# Patient Record
Sex: Female | Born: 1985 | Race: White | Hispanic: No | Marital: Married | Smoking: Never smoker
Health system: Southern US, Community
[De-identification: ages and names within clinical notes are randomized; demographics above are authoritative.]

## PROBLEM LIST (undated history)

## (undated) ENCOUNTER — Inpatient Hospital Stay: Payer: Self-pay

## (undated) DIAGNOSIS — O24419 Gestational diabetes mellitus in pregnancy, unspecified control: Secondary | ICD-10-CM

## (undated) DIAGNOSIS — E669 Obesity, unspecified: Secondary | ICD-10-CM

## (undated) HISTORY — DX: Obesity, unspecified: E66.9

## (undated) HISTORY — PX: NO PAST SURGERIES: SHX2092

## (undated) HISTORY — DX: Gestational diabetes mellitus in pregnancy, unspecified control: O24.419

---

## 2010-04-04 ENCOUNTER — Emergency Department: Payer: Self-pay | Admitting: Internal Medicine

## 2010-06-25 ENCOUNTER — Emergency Department: Payer: Self-pay | Admitting: Emergency Medicine

## 2011-10-01 ENCOUNTER — Emergency Department: Payer: Self-pay | Admitting: Emergency Medicine

## 2012-04-25 ENCOUNTER — Observation Stay: Payer: Self-pay

## 2012-04-27 LAB — BETA STREP CULTURE(ARMC)

## 2012-05-16 ENCOUNTER — Inpatient Hospital Stay: Payer: Self-pay | Admitting: Obstetrics & Gynecology

## 2012-05-16 LAB — CBC WITH DIFFERENTIAL/PLATELET
Basophil %: 0.2 %
HCT: 33.1 % — ABNORMAL LOW (ref 35.0–47.0)
HGB: 11.6 g/dL — ABNORMAL LOW (ref 12.0–16.0)
Lymphocyte #: 2.2 10*3/uL (ref 1.0–3.6)
MCH: 31.3 pg (ref 26.0–34.0)
MCHC: 34.9 g/dL (ref 32.0–36.0)
MCV: 90 fL (ref 80–100)
Monocyte %: 7.6 %
Neutrophil #: 6.6 10*3/uL — ABNORMAL HIGH (ref 1.4–6.5)
RDW: 14.3 % (ref 11.5–14.5)

## 2012-05-18 LAB — HEMATOCRIT: HCT: 29.4 % — ABNORMAL LOW (ref 35.0–47.0)

## 2012-06-28 LAB — HM PAP SMEAR: HM Pap smear: NEGATIVE

## 2012-09-20 ENCOUNTER — Emergency Department: Payer: Self-pay | Admitting: Emergency Medicine

## 2013-01-25 ENCOUNTER — Emergency Department: Payer: Self-pay | Admitting: Emergency Medicine

## 2013-04-17 ENCOUNTER — Emergency Department: Payer: Self-pay | Admitting: Emergency Medicine

## 2013-05-27 ENCOUNTER — Emergency Department: Payer: Self-pay | Admitting: Family Medicine

## 2013-08-18 IMAGING — CR DG LUMBAR SPINE 2-3V
1 series · 4 of 4 positions shown · non-contrast
Comparison: none

REASON FOR EXAM: radicular LBP
COMMENTS:   LMP: IUD

PROCEDURE:     DXR - DXR LUMBAR SPINE AP AND LATERAL  - January 25, 2013  [DATE]
RESULT:     Comparison: None.

[Series 1: t lumbar spine ap · 0.14mm/px · 4 of 4 slices shown]
[im 1/4]
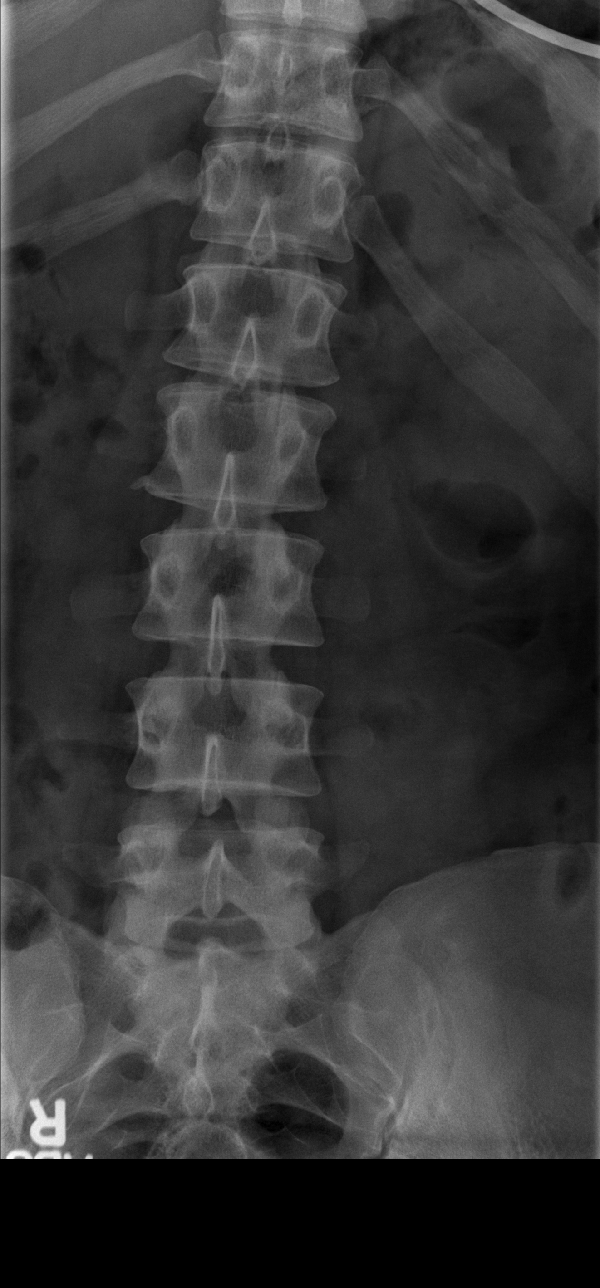
[im 2/4]
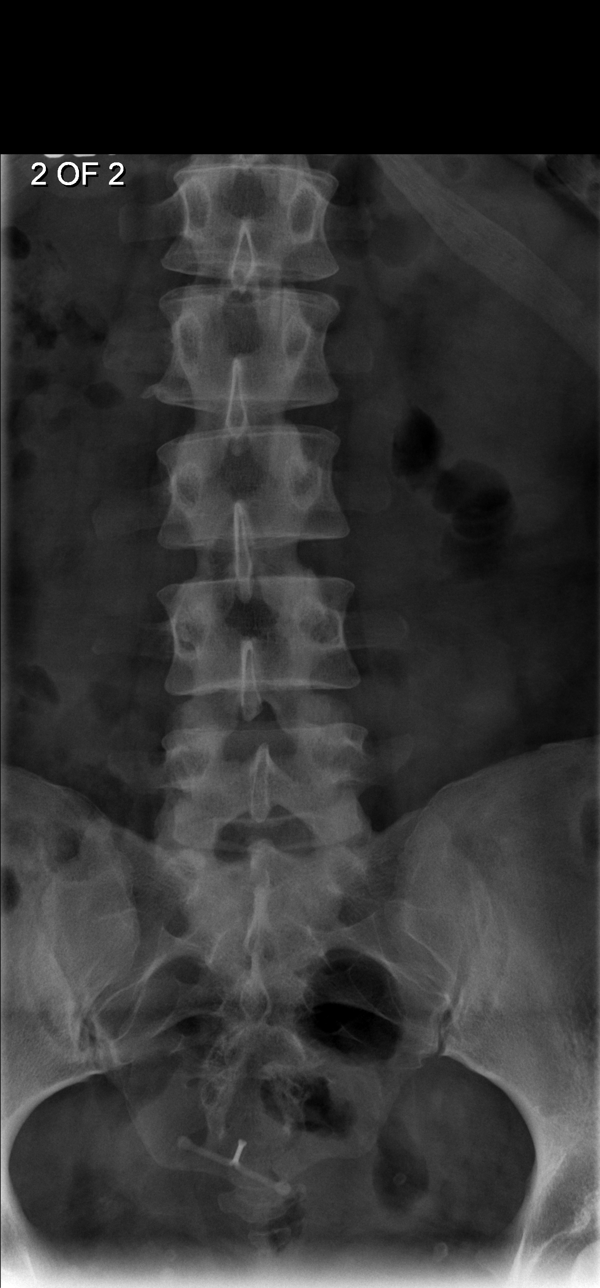
[im 3/4]
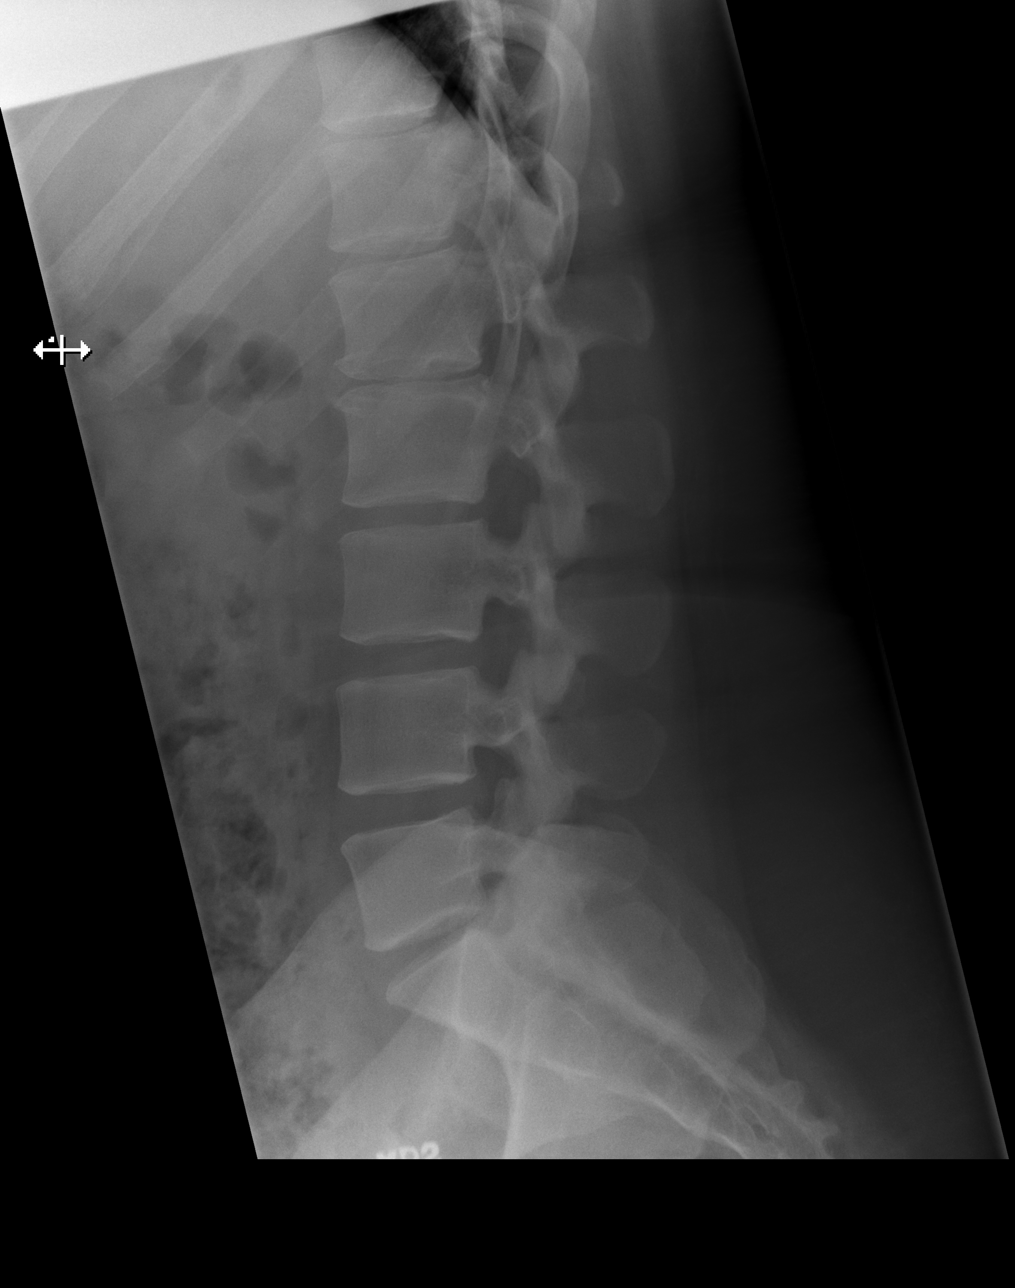
[im 4/4]
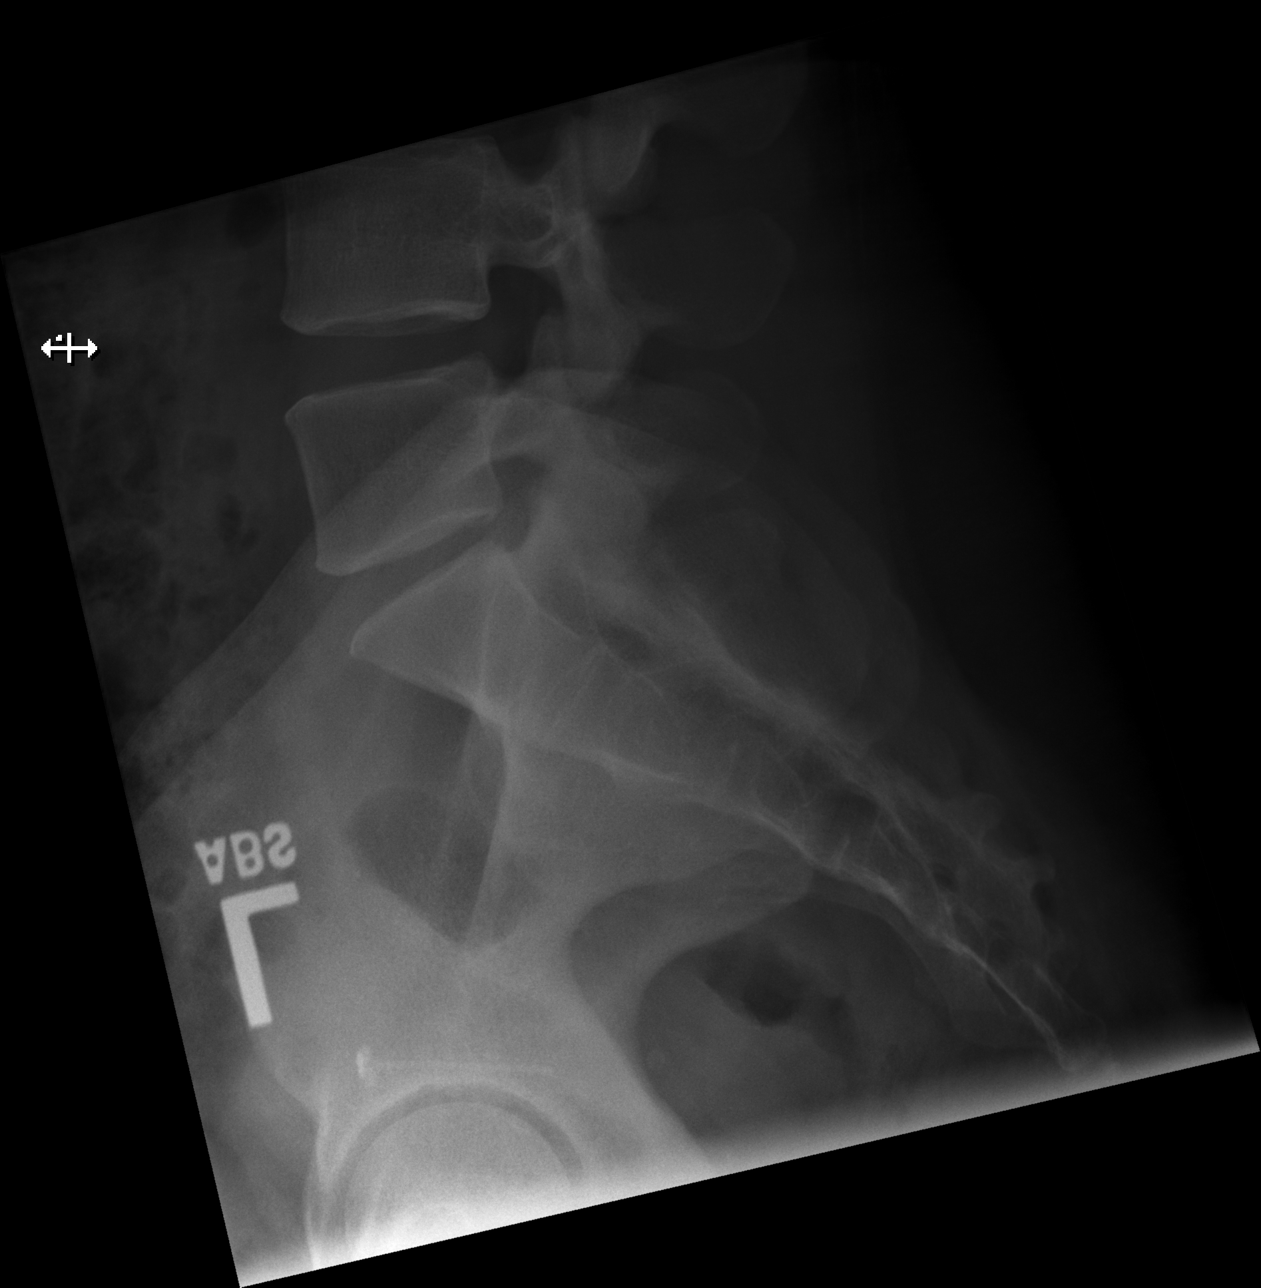

[4 of 4 positions shown; findings below may reference images not displayed]

FINDINGS: There are 5 lumbar type vertebral bodies. Mild degenerative disc disease is
seen at L1-L2. Vertebral body heights are preserved. Normal alignment. An
intrauterine contraceptive device projects over the pelvis.
IMPRESSION: Degenerative disc disease at L1-L2.

[REDACTED]

## 2014-03-15 ENCOUNTER — Emergency Department: Payer: Self-pay | Admitting: Emergency Medicine

## 2014-07-19 ENCOUNTER — Emergency Department: Payer: Self-pay | Admitting: Emergency Medicine

## 2015-01-14 NOTE — H&P (Signed)
L&D Evaluation:  History Expanded:   HPI 29 yo G2 P1, presents at 1537 3/7 weeks with c/o contractions. EDD of 05/13/12 per LMP and 9 week US. Dilated 1 cm on admission, cervix unchanged. [facility] for HR protocol for BMI 41. Fetus in transverse lie at last PN visit, consult for ECV this week.    Blood Type (Maternal) O positive    Group B Strep Results Maternal (Result >5wks must be treated as unknown) unknown/result > 5 weeks ago    Maternal HIV Negative    Maternal Syphilis Ab Nonreactive    Maternal Varicella Immune    Rubella Results (Maternal) immune    Maternal T-Dap Unknown    Patient's Medical History Obesity    Patient's Surgical History none    Medications Pre Natal Vitamins    Allergies NKDA    Social History none   ROS:   ROS see HPI   Exam:   Vital Signs stable    General no apparent distress    Mental Status clear    Abdomen gravid, tender with fetal movements    Pelvic 1/long/high    Mebranes Intact    FHT normal rate with no decels    Ucx irregular   Impression:   Impression early labor   Plan:   Plan discharge    Comments GBS swab drawn as pt did not have done in office last week   Electronic Signatures: Vella KohlerBrothers, Lareina Espino K (CNM)  (Signed 20-Aug-13 14:05)  Authored: L&D Evaluation   Last Updated: 20-Aug-13 14:05 by Vella KohlerBrothers, Blayke Cordrey K (CNM)

## 2017-08-19 ENCOUNTER — Ambulatory Visit (INDEPENDENT_AMBULATORY_CARE_PROVIDER_SITE_OTHER): Payer: Medicaid Other | Admitting: Advanced Practice Midwife

## 2017-08-19 ENCOUNTER — Encounter: Payer: Self-pay | Admitting: Advanced Practice Midwife

## 2017-08-19 VITALS — BP 112/78 | HR 91 | Ht 69.0 in | Wt 320.0 lb

## 2017-08-19 DIAGNOSIS — Z124 Encounter for screening for malignant neoplasm of cervix: Secondary | ICD-10-CM

## 2017-08-19 DIAGNOSIS — Z01419 Encounter for gynecological examination (general) (routine) without abnormal findings: Secondary | ICD-10-CM

## 2017-08-19 DIAGNOSIS — Z309 Encounter for contraceptive management, unspecified: Secondary | ICD-10-CM | POA: Diagnosis not present

## 2017-08-19 DIAGNOSIS — Z30432 Encounter for removal of intrauterine contraceptive device: Secondary | ICD-10-CM | POA: Diagnosis not present

## 2017-08-19 NOTE — Progress Notes (Signed)
Patient ID: Patricia Vazquez, female   DOB: 26-Nov-1985, 31 y.o.   MRN: [id number]     Gynecology Annual Exam  PCP: Patient, No Pcp Per  Chief Complaint:  Chief Complaint  Patient presents with  . Gynecologic Exam  . IUD removal    History of Present Illness: Patient is a 31 y.o. G2P2 presents for annual exam. The patient has no complaints today. Her IUD is due for removal. She plans hormone free interval and may want pregnancy.   LMP: No LMP recorded. Average Interval: irregular, not applicable  Duration of flow: 1 day Heavy Menses: spotting Clots: no Intermenstrual Bleeding: no Postcoital Bleeding: no Dysmenorrhea: no  The patient is sexually active. She currently uses IUD for contraception. She denies dyspareunia.  The patient does perform self breast exams.  There is no notable family history of breast or ovarian cancer in her family.  The patient wears seatbelts: yes.   The patient has regular exercise: she is active but denies adequate cardio activity.    The patient denies current symptoms of depression.    Review of Systems: Review of Systems  Constitutional: Negative.   HENT: Negative.   Eyes: Negative.   Respiratory: Negative.   Cardiovascular: Negative.   Gastrointestinal: Negative.   Genitourinary: Negative.   Musculoskeletal: Negative.   Skin: Negative.   Neurological: Negative.   Endo/Heme/Allergies: Negative.   Psychiatric/Behavioral: Negative.     Past Medical History:  History reviewed. No pertinent past medical history.  Past Surgical History:  History reviewed. No pertinent surgical history.  Gynecologic History:  No LMP recorded. Contraception: IUD Last Pap: 5 years ago Results were: no abnormalities   Obstetric History: G2P2  Family History:  History reviewed. No pertinent family history.  Social History:  Social History   Socioeconomic History  . Marital status: Single    Spouse name: Not on file  . Number of children: Not on  file  . Years of education: Not on file  . Highest education level: Not on file  Social Needs  . Financial resource strain: Not on file  . Food insecurity - worry: Not on file  . Food insecurity - inability: Not on file  . Transportation needs - medical: Not on file  . Transportation needs - non-medical: Not on file  Occupational History  . Not on file  Tobacco Use  . Smoking status: Never Smoker  . Smokeless tobacco: Never Used  Substance and Sexual Activity  . Alcohol use: Yes  . Drug use: No  . Sexual activity: Yes    Partners: Male    Birth control/protection: None, IUD  Other Topics Concern  . Not on file  Social History Narrative  . Not on file    Allergies:  No Known Allergies  Medications: Prior to Admission medications   Not on File    Physical Exam Vitals: Blood pressure 112/78, pulse 91, height 5\' 9"  (1.753 m), weight (!) 320 lb (145.2 kg).  General: NAD HEENT: normocephalic, anicteric Thyroid: no enlargement, no palpable nodules Pulmonary: No increased work of breathing, CTAB Cardiovascular: RRR, distal pulses 2+ Breast: Breast symmetrical, no tenderness, no palpable nodules or masses, no skin or nipple retraction present, no nipple discharge.  No axillary or supraclavicular lymphadenopathy. Abdomen: NABS, soft, non-tender, non-distended.  Umbilicus without lesions.  No hepatomegaly, splenomegaly or masses palpable. No evidence of hernia  Genitourinary:  External: Normal external female genitalia.  Normal urethral meatus, normal  Bartholin's and Skene's glands.    Vagina: Normal  vaginal mucosa, no evidence of prolapse.    Cervix: Grossly normal in appearance, no bleeding, no CMT  Uterus: Non-enlarged, mobile, normal contour.    Adnexa: ovaries non-enlarged, no adnexal masses  Rectal: deferred  Lymphatic: no evidence of inguinal lymphadenopathy Extremities: no edema, erythema, or tenderness Neurologic: Grossly intact Psychiatric: mood appropriate,  affect full   Assessment: 31 y.o. G2P2 routine annual exam  Plan: Problem List Items Addressed This Visit    None    Visit Diagnoses    Well woman exam with routine gynecological exam    -  Primary   Relevant Orders   IGP, Aptima HPV   Cervical cancer screening       Relevant Orders   IGP, Aptima HPV      1) STI screening was offered and declined  2) ASCCP guidelines and rational discussed.  Patient opts for every 5 years screening interval  3) Contraception - none  4) Routine healthcare maintenance including cholesterol, diabetes screening discussed managed by PCP  5) Follow up 1 year for routine annual exam  [name], CNM       GYNECOLOGY OFFICE PROCEDURE NOTE  Patricia CarolinaKimberly L Mcomber is a 31 y.o. G2P2 here for IUD removal. The patient currently has a Mirena IUD placed on 07/10/2012.  No GYN concerns.  Last pap smear was on 06/29/2012 and was normal.  IUD Removal and Reinsertion  Patient identified, informed consent performed, consent signed. Time out was performed. Speculum placed in the vagina. The strings of the IUD were grasped and pulled using curved forceps. The IUD was successfully removed in its entirety. Patient tolerated procedure well.   Patient was instructed to take Ibuprofen for discomfort following removal of IUD.   Tresea MallJane Gedeon Brandow, CNM  [facility], [facility]   IUD remval [id number] Modifer 25, plus Modifer 79 is done during a global billing visit

## 2017-08-24 LAB — IGP, APTIMA HPV
HPV Aptima: NEGATIVE
PAP Smear Comment: 0

## 2017-10-12 ENCOUNTER — Ambulatory Visit (INDEPENDENT_AMBULATORY_CARE_PROVIDER_SITE_OTHER): Payer: Medicaid Other | Admitting: Maternal Newborn

## 2017-10-12 ENCOUNTER — Encounter: Payer: Self-pay | Admitting: Maternal Newborn

## 2017-10-12 VITALS — BP 120/70 | Wt 322.0 lb

## 2017-10-12 DIAGNOSIS — Z3A01 Less than 8 weeks gestation of pregnancy: Secondary | ICD-10-CM

## 2017-10-12 DIAGNOSIS — Z6841 Body Mass Index (BMI) 40.0 and over, adult: Secondary | ICD-10-CM

## 2017-10-12 DIAGNOSIS — O99210 Obesity complicating pregnancy, unspecified trimester: Secondary | ICD-10-CM | POA: Insufficient documentation

## 2017-10-12 DIAGNOSIS — O99211 Obesity complicating pregnancy, first trimester: Secondary | ICD-10-CM | POA: Diagnosis not present

## 2017-10-12 DIAGNOSIS — Z3480 Encounter for supervision of other normal pregnancy, unspecified trimester: Secondary | ICD-10-CM

## 2017-10-12 DIAGNOSIS — O0990 Supervision of high risk pregnancy, unspecified, unspecified trimester: Secondary | ICD-10-CM | POA: Insufficient documentation

## 2017-10-12 NOTE — Progress Notes (Signed)
10/12/2017   Chief Complaint: Positive home pregnancy test, desires prenatal care.  Transfer of Care Patient: no  History of Present Illness: Ms. Landry Mellowerrell is a 32 y.o. G3P2002 at 7473w3d based on Patient's last menstrual period on 08/21/2017, with an Estimated Date of Delivery: 05/28/18, with the above CC.   She had no periods with IUD, which was removed on 08/19/2017. She had bleeding once following IUD removal and conceived after that. Her LMP is not certain. Adjust dating as needed after early ultrasound complete. She was using no method when she conceived.  She has Positive signs or symptoms of nausea of pregnancy. Does not desire [place] at this time. She has Negative signs or symptoms of miscarriage or preterm labor She identifies Negative Zika risk factors for her and her partner On any different medications around the time she conceived/early pregnancy: No  History of varicella: Yes   ROS: A 12-point review of systems was performed and negative, except as stated in the above HPI.  OBGYN History: As per HPI. OB History  Gravida Para Term Preterm AB Living  3 2 2     2   SAB TAB Ectopic Multiple Live Births          2    # Outcome Date GA Lbr Len/2nd Weight Sex Delivery Anes PTL Lv  3 Current           2 Term 05/17/12 2759w0d  9 lb 1 oz (4.111 kg) F Vag-Spont   LIV  1 Term 05/14/08 4259w0d  7 lb 1 oz (3.204 kg) M Vag-Spont   LIV     Birth Comments: BABY C POLYCYTHEMIA, JAUNDICE, HYPOTHERMIA, AND HYPOGLYCEMIA, STAYED IN NICU X4-5DAYS      Any issues with any prior pregnancies: no Any prior children are healthy, doing well, without any problems or issues: yes History of pap smears: Yes. Last pap smear 08/19/2017. NILM/HPV Negative History of STIs: Yes, trichomoniasis in 08/2017.   Past Medical History: Past Medical History:  Diagnosis Date  . Obesity     Past Surgical History: Past Surgical History:  Procedure Laterality Date  . NO PAST SURGERIES      Family History:    History reviewed. No pertinent family history. She denies any female cancers, bleeding or blood clotting disorders.  She denies any history of intellectual disability, birth defects or genetic disorders in her or the FOB's history  Social History:  Social History   Socioeconomic History  . Marital status: Married    Spouse name: Not on file  . Number of children: 2  . Years of education: 5412  . Highest education level: Not on file  Social Needs  . Financial resource strain: Not on file  . Food insecurity - worry: Not on file  . Food insecurity - inability: Not on file  . Transportation needs - medical: Not on file  . Transportation needs - non-medical: Not on file  Occupational History  . Occupation: Conservation officer, [employer]  Tobacco Use  . Smoking status: Never Smoker  . Smokeless tobacco: Never Used  Substance and Sexual Activity  . Alcohol use: Yes  . Drug use: No  . Sexual activity: Yes    Partners: Male    Birth control/protection: None  Other Topics Concern  . Not on file  Social History Narrative  . Not on file   Any cats in the household: no Denies history of and current domestic violence.  Allergy: Allergies  Allergen Reactions  . Other  SEASONAL    Current Outpatient Medications: No current outpatient medications on file.   Physical Exam:   BP 120/70   Wt (!) 322 lb (146.1 kg)   LMP 08/21/2017   BMI 47.55 kg/m  Body mass index is 47.55 kg/m. Constitutional: Obese, well developed female in no acute distress.  Neck:  Supple, normal appearance, and no thyromegaly  Cardiovascular: S1, S2 normal, no murmur, rub or gallop, regular rate and rhythm Respiratory:  Clear to auscultation bilaterally. Normal respiratory effort Abdomen: no masses, hernias; diffusely non tender to palpation, non distended Breasts: right breast normal without mass, skin or nipple changes or axillary nodes, left breast normal without mass, skin or nipple changes or axillary  nodes. Neuro/Psych:  Normal mood and affect.  Skin:  Warm and dry.  Lymphatic:  No inguinal lymphadenopathy.   Pelvic exam: is limited by body habitus External genitalia, Bartholin's glands, Urethra, Skene's glands: within normal limits Vagina: within normal limits and with no blood in the vault  Cervix: normal appearing cervix without discharge or lesions, closed/long/high Uterus:  normal contour Adnexa:  normal adnexa  Assessment: Ms. Rarick is a 32 y.o. G3P2002 at [redacted]w[redacted]d based on Patient's last menstrual period on 08/21/2017, with an Estimated Date of Delivery: 05/28/18, presenting for prenatal care.  Plan:  1) Avoid alcoholic beverages. 2) Patient encouraged not to smoke.  3) Discontinue the use of all non-medicinal drugs and chemicals.  4) Take prenatal vitamins daily.  5) Seatbelt use advised. 6) Nutrition, food safety (fish, cheese advisories, and high nitrite foods) and exercise discussed. 7) Hospital and practice style and delivering at [facility] discussed.  8) Patient is asked about travel to areas at risk for the Zika virus, and counseled to avoid travel and exposure to mosquitoes or sexual partners who may have themselves been exposed to the virus. Testing is discussed, and will be ordered as appropriate.  9) Childbirth classes at [facility] advised. 10) Genetic Screening, such as with 1st Trimester Screening, cell free fetal DNA, AFP testing, and Ultrasound, as well as with amniocentesis and CVS as appropriate, is discussed with patient. She plans to decline genetic testing this pregnancy. 11) BMI>45, GTT with NOB labs, schedule labs for next visit. 12) Ultrasound ordered for dates/viability.  Problem list reviewed and updated.  Return in about 1 week (around 10/19/2017) for ROB, ultrasound, GTT/labs.  [name], CNM [facility], [facility] 10/12/2017  9:58 AM

## 2017-10-12 NOTE — Patient Instructions (Signed)
First Trimester of Pregnancy The first trimester of pregnancy is from week 1 until the end of week 13 (months 1 through 3). A week after a sperm fertilizes an egg, the egg will implant on the wall of the uterus. This embryo will begin to develop into a baby. Genes from you and your partner will form the baby. The female genes will determine whether the baby will be a boy or a girl. At 6-8 weeks, the eyes and face will be formed, and the heartbeat can be seen on ultrasound. At the end of 12 weeks, all the baby's organs will be formed. Now that you are pregnant, you will want to do everything you can to have a healthy baby. Two of the most important things are to get good prenatal care and to follow your health care provider's instructions. Prenatal care is all the medical care you receive before the baby's birth. This care will help prevent, find, and treat any problems during the pregnancy and childbirth. Body changes during your first trimester Your body goes through many changes during pregnancy. The changes vary from woman to woman.  You may gain or lose a couple of pounds at first.  You may feel sick to your stomach (nauseous) and you may throw up (vomit). If the vomiting is uncontrollable, call your health care provider.  You may tire easily.  You may develop headaches that can be relieved by medicines. All medicines should be approved by your health care provider.  You may urinate more often. Painful urination may mean you have a bladder infection.  You may develop heartburn as a result of your pregnancy.  You may develop constipation because certain hormones are causing the muscles that push stool through your intestines to slow down.  You may develop hemorrhoids or swollen veins (varicose veins).  Your breasts may begin to grow larger and become tender. Your nipples may stick out more, and the tissue that surrounds them (areola) may become darker.  Your gums may bleed and may be  sensitive to brushing and flossing.  Dark spots or blotches (chloasma, mask of pregnancy) may develop on your face. This will likely fade after the baby is born.  Your menstrual periods will stop.  You may have a loss of appetite.  You may develop cravings for certain kinds of food.  You may have changes in your emotions from day to day, such as being excited to be pregnant or being concerned that something may go wrong with the pregnancy and baby.  You may have more vivid and strange dreams.  You may have changes in your hair. These can include thickening of your hair, rapid growth, and changes in texture. Some women also have hair loss during or after pregnancy, or hair that feels dry or thin. Your hair will most likely return to normal after your baby is born.  What to expect at prenatal visits During a routine prenatal visit:  You will be weighed to make sure you and the baby are growing normally.  Your blood pressure will be taken.  Your abdomen will be measured to track your baby's growth.  The fetal heartbeat will be listened to between weeks 10 and 14 of your pregnancy.  Test results from any previous visits will be discussed.  Your health care provider may ask you:  How you are feeling.  If you are feeling the baby move.  If you have had any abnormal symptoms, such as leaking fluid, bleeding, severe headaches,   or abdominal cramping.  If you are using any tobacco products, including cigarettes, chewing tobacco, and electronic cigarettes.  If you have any questions.  Other tests that may be performed during your first trimester include:  Blood tests to find your blood type and to check for the presence of any previous infections. The tests will also be used to check for low iron levels (anemia) and protein on red blood cells (Rh antibodies). Depending on your risk factors, or if you previously had diabetes during pregnancy, you may have tests to check for high blood  sugar that affects pregnant women (gestational diabetes).  Urine tests to check for infections, diabetes, or protein in the urine.  An ultrasound to confirm the proper growth and development of the baby.  Fetal screens for spinal cord problems (spina bifida) and Down syndrome.  HIV (human immunodeficiency virus) testing. Routine prenatal testing includes screening for HIV, unless you choose not to have this test.  You may need other tests to make sure you and the baby are doing well.  Follow these instructions at home: Medicines  Follow your health care provider's instructions regarding medicine use. Specific medicines may be either safe or unsafe to take during pregnancy.  Take a prenatal vitamin that contains at least 600 micrograms (mcg) of folic acid.  If you develop constipation, try taking a stool softener if your health care provider approves. Eating and drinking  Eat a balanced diet that includes fresh fruits and vegetables, whole grains, good sources of protein such as meat, eggs, or tofu, and low-fat dairy. Your health care provider will help you determine the amount of weight gain that is right for you.  Avoid raw meat and uncooked cheese. These carry germs that can cause birth defects in the baby.  Eating four or five small meals rather than three large meals a day may help relieve nausea and vomiting. If you start to feel nauseous, eating a few soda crackers can be helpful. Drinking liquids between meals, instead of during meals, also seems to help ease nausea and vomiting.  Limit foods that are high in fat and processed sugars, such as fried and sweet foods.  To prevent constipation: ? Eat foods that are high in fiber, such as fresh fruits and vegetables, whole grains, and beans. ? Drink enough fluid to keep your urine clear or pale yellow. Activity  Exercise only as directed by your health care provider. Most women can continue their usual exercise routine during  pregnancy. Try to exercise for 30 minutes at least 5 days a week. Exercising will help you: ? Control your weight. ? Stay in shape. ? Be prepared for labor and delivery.  Experiencing pain or cramping in the lower abdomen or lower back is a good sign that you should stop exercising. Check with your health care provider before continuing with normal exercises.  Try to avoid standing for long periods of time. Move your legs often if you must stand in one place for a long time.  Avoid heavy lifting.  Wear low-heeled shoes and practice good posture.  You may continue to have sex unless your health care provider tells you not to. Relieving pain and discomfort  Wear a good support bra to relieve breast tenderness.  Take warm sitz baths to soothe any pain or discomfort caused by hemorrhoids. Use hemorrhoid cream if your health care provider approves.  Rest with your legs elevated if you have leg cramps or low back pain.  If you develop   varicose veins in your legs, wear support hose. Elevate your feet for 15 minutes, 3-4 times a day. Limit salt in your diet. Prenatal care  Schedule your prenatal visits by the twelfth week of pregnancy. They are usually scheduled monthly at first, then more often in the last 2 months before delivery.  Write down your questions. Take them to your prenatal visits.  Keep all your prenatal visits as told by your health care provider. This is important. Safety  Wear your seat belt at all times when driving.  Make a list of emergency phone numbers, including numbers for family, friends, the hospital, and police and fire departments. General instructions  Ask your health care provider for a referral to a local prenatal education class. Begin classes no later than the beginning of month 6 of your pregnancy.  Ask for help if you have counseling or nutritional needs during pregnancy. Your health care provider can offer advice or refer you to specialists for help  with various needs.  Do not use hot tubs, steam rooms, or saunas.  Do not douche or use tampons or scented sanitary pads.  Do not cross your legs for long periods of time.  Avoid cat litter boxes and soil used by cats. These carry germs that can cause birth defects in the baby and possibly loss of the fetus by miscarriage or stillbirth.  Avoid all smoking, herbs, alcohol, and medicines not prescribed by your health care provider. Chemicals in these products affect the formation and growth of the baby.  Do not use any products that contain nicotine or tobacco, such as cigarettes and e-cigarettes. If you need help quitting, ask your health care provider. You may receive counseling support and other resources to help you quit.  Schedule a dentist appointment. At home, brush your teeth with a soft toothbrush and be gentle when you floss. Contact a health care provider if:  You have dizziness.  You have mild pelvic cramps, pelvic pressure, or nagging pain in the abdominal area.  You have persistent nausea, vomiting, or diarrhea.  You have a bad smelling vaginal discharge.  You have pain when you urinate.  You notice increased swelling in your face, hands, legs, or ankles.  You are exposed to fifth disease or chickenpox.  You are exposed to German measles (rubella) and have never had it. Get help right away if:  You have a fever.  You are leaking fluid from your vagina.  You have spotting or bleeding from your vagina.  You have severe abdominal cramping or pain.  You have rapid weight gain or loss.  You vomit blood or material that looks like coffee grounds.  You develop a severe headache.  You have shortness of breath.  You have any kind of trauma, such as from a fall or a car accident. Summary  The first trimester of pregnancy is from week 1 until the end of week 13 (months 1 through 3).  Your body goes through many changes during pregnancy. The changes vary from  woman to woman.  You will have routine prenatal visits. During those visits, your health care provider will examine you, discuss any test results you may have, and talk with you about how you are feeling. This information is not intended to replace advice given to you by your health care provider. Make sure you discuss any questions you have with your health care provider. Document Released: 08/17/2001 Document Revised: 08/04/2016 Document Reviewed: 08/04/2016 Elsevier Interactive Patient Education  2018 Elsevier   Inc.  

## 2017-10-12 NOTE — Progress Notes (Signed)
No [email or web address] 

## 2017-10-14 ENCOUNTER — Other Ambulatory Visit: Payer: Self-pay | Admitting: Maternal Newborn

## 2017-10-14 ENCOUNTER — Ambulatory Visit (INDEPENDENT_AMBULATORY_CARE_PROVIDER_SITE_OTHER): Payer: Medicaid Other | Admitting: Obstetrics & Gynecology

## 2017-10-14 ENCOUNTER — Ambulatory Visit (INDEPENDENT_AMBULATORY_CARE_PROVIDER_SITE_OTHER): Payer: Medicaid Other

## 2017-10-14 ENCOUNTER — Ambulatory Visit: Payer: Medicaid Other

## 2017-10-14 VITALS — BP 118/72 | Wt 322.0 lb

## 2017-10-14 DIAGNOSIS — Z3480 Encounter for supervision of other normal pregnancy, unspecified trimester: Secondary | ICD-10-CM | POA: Diagnosis not present

## 2017-10-14 DIAGNOSIS — Z3490 Encounter for supervision of normal pregnancy, unspecified, unspecified trimester: Secondary | ICD-10-CM

## 2017-10-14 DIAGNOSIS — Z6841 Body Mass Index (BMI) 40.0 and over, adult: Secondary | ICD-10-CM

## 2017-10-14 LAB — GC/CHLAMYDIA PROBE AMP
CHLAMYDIA, DNA PROBE: NEGATIVE
Neisseria gonorrhoeae by PCR: NEGATIVE

## 2017-10-14 LAB — URINE DRUG PANEL 7
Amphetamines, Urine: NEGATIVE ng/mL
Barbiturate Quant, Ur: NEGATIVE ng/mL
Benzodiazepine Quant, Ur: NEGATIVE ng/mL
CANNABINOID QUANT UR: NEGATIVE ng/mL
COCAINE (METAB.): NEGATIVE ng/mL
Opiate Quant, Ur: NEGATIVE ng/mL
PCP Quant, Ur: NEGATIVE ng/mL

## 2017-10-14 LAB — URINE CULTURE

## 2017-10-14 NOTE — Patient Instructions (Signed)
First Trimester of Pregnancy The first trimester of pregnancy is from week 1 until the end of week 13 (months 1 through 3). A week after a sperm fertilizes an egg, the egg will implant on the wall of the uterus. This embryo will begin to develop into a baby. Genes from you and your partner will form the baby. The female genes will determine whether the baby will be a boy or a girl. At 6-8 weeks, the eyes and face will be formed, and the heartbeat can be seen on ultrasound. At the end of 12 weeks, all the baby's organs will be formed. Now that you are pregnant, you will want to do everything you can to have a healthy baby. Two of the most important things are to get good prenatal care and to follow your health care provider's instructions. Prenatal care is all the medical care you receive before the baby's birth. This care will help prevent, find, and treat any problems during the pregnancy and childbirth. Body changes during your first trimester Your body goes through many changes during pregnancy. The changes vary from woman to woman.  You may gain or lose a couple of pounds at first.  You may feel sick to your stomach (nauseous) and you may throw up (vomit). If the vomiting is uncontrollable, call your health care provider.  You may tire easily.  You may develop headaches that can be relieved by medicines. All medicines should be approved by your health care provider.  You may urinate more often. Painful urination may mean you have a bladder infection.  You may develop heartburn as a result of your pregnancy.  You may develop constipation because certain hormones are causing the muscles that push stool through your intestines to slow down.  You may develop hemorrhoids or swollen veins (varicose veins).  Your breasts may begin to grow larger and become tender. Your nipples may stick out more, and the tissue that surrounds them (areola) may become darker.  Your gums may bleed and may be  sensitive to brushing and flossing.  Dark spots or blotches (chloasma, mask of pregnancy) may develop on your face. This will likely fade after the baby is born.  Your menstrual periods will stop.  You may have a loss of appetite.  You may develop cravings for certain kinds of food.  You may have changes in your emotions from day to day, such as being excited to be pregnant or being concerned that something may go wrong with the pregnancy and baby.  You may have more vivid and strange dreams.  You may have changes in your hair. These can include thickening of your hair, rapid growth, and changes in texture. Some women also have hair loss during or after pregnancy, or hair that feels dry or thin. Your hair will most likely return to normal after your baby is born.  What to expect at prenatal visits During a routine prenatal visit:  You will be weighed to make sure you and the baby are growing normally.  Your blood pressure will be taken.  Your abdomen will be measured to track your baby's growth.  The fetal heartbeat will be listened to between weeks 10 and 14 of your pregnancy.  Test results from any previous visits will be discussed.  Your health care provider may ask you:  How you are feeling.  If you are feeling the baby move.  If you have had any abnormal symptoms, such as leaking fluid, bleeding, severe headaches,   or abdominal cramping.  If you are using any tobacco products, including cigarettes, chewing tobacco, and electronic cigarettes.  If you have any questions.  Other tests that may be performed during your first trimester include:  Blood tests to find your blood type and to check for the presence of any previous infections. The tests will also be used to check for low iron levels (anemia) and protein on red blood cells (Rh antibodies). Depending on your risk factors, or if you previously had diabetes during pregnancy, you may have tests to check for high blood  sugar that affects pregnant women (gestational diabetes).  Urine tests to check for infections, diabetes, or protein in the urine.  An ultrasound to confirm the proper growth and development of the baby.  Fetal screens for spinal cord problems (spina bifida) and Down syndrome.  HIV (human immunodeficiency virus) testing. Routine prenatal testing includes screening for HIV, unless you choose not to have this test.  You may need other tests to make sure you and the baby are doing well.  Follow these instructions at home: Medicines  Follow your health care provider's instructions regarding medicine use. Specific medicines may be either safe or unsafe to take during pregnancy.  Take a prenatal vitamin that contains at least 600 micrograms (mcg) of folic acid.  If you develop constipation, try taking a stool softener if your health care provider approves. Eating and drinking  Eat a balanced diet that includes fresh fruits and vegetables, whole grains, good sources of protein such as meat, eggs, or tofu, and low-fat dairy. Your health care provider will help you determine the amount of weight gain that is right for you.  Avoid raw meat and uncooked cheese. These carry germs that can cause birth defects in the baby.  Eating four or five small meals rather than three large meals a day may help relieve nausea and vomiting. If you start to feel nauseous, eating a few soda crackers can be helpful. Drinking liquids between meals, instead of during meals, also seems to help ease nausea and vomiting.  Limit foods that are high in fat and processed sugars, such as fried and sweet foods.  To prevent constipation: ? Eat foods that are high in fiber, such as fresh fruits and vegetables, whole grains, and beans. ? Drink enough fluid to keep your urine clear or pale yellow. Activity  Exercise only as directed by your health care provider. Most women can continue their usual exercise routine during  pregnancy. Try to exercise for 30 minutes at least 5 days a week. Exercising will help you: ? Control your weight. ? Stay in shape. ? Be prepared for labor and delivery.  Experiencing pain or cramping in the lower abdomen or lower back is a good sign that you should stop exercising. Check with your health care provider before continuing with normal exercises.  Try to avoid standing for long periods of time. Move your legs often if you must stand in one place for a long time.  Avoid heavy lifting.  Wear low-heeled shoes and practice good posture.  You may continue to have sex unless your health care provider tells you not to. Relieving pain and discomfort  Wear a good support bra to relieve breast tenderness.  Take warm sitz baths to soothe any pain or discomfort caused by hemorrhoids. Use hemorrhoid cream if your health care provider approves.  Rest with your legs elevated if you have leg cramps or low back pain.  If you develop   varicose veins in your legs, wear support hose. Elevate your feet for 15 minutes, 3-4 times a day. Limit salt in your diet. Prenatal care  Schedule your prenatal visits by the twelfth week of pregnancy. They are usually scheduled monthly at first, then more often in the last 2 months before delivery.  Write down your questions. Take them to your prenatal visits.  Keep all your prenatal visits as told by your health care provider. This is important. Safety  Wear your seat belt at all times when driving.  Make a list of emergency phone numbers, including numbers for family, friends, the hospital, and police and fire departments. General instructions  Ask your health care provider for a referral to a local prenatal education class. Begin classes no later than the beginning of month 6 of your pregnancy.  Ask for help if you have counseling or nutritional needs during pregnancy. Your health care provider can offer advice or refer you to specialists for help  with various needs.  Do not use hot tubs, steam rooms, or saunas.  Do not douche or use tampons or scented sanitary pads.  Do not cross your legs for long periods of time.  Avoid cat litter boxes and soil used by cats. These carry germs that can cause birth defects in the baby and possibly loss of the fetus by miscarriage or stillbirth.  Avoid all smoking, herbs, alcohol, and medicines not prescribed by your health care provider. Chemicals in these products affect the formation and growth of the baby.  Do not use any products that contain nicotine or tobacco, such as cigarettes and e-cigarettes. If you need help quitting, ask your health care provider. You may receive counseling support and other resources to help you quit.  Schedule a dentist appointment. At home, brush your teeth with a soft toothbrush and be gentle when you floss. Contact a health care provider if:  You have dizziness.  You have mild pelvic cramps, pelvic pressure, or nagging pain in the abdominal area.  You have persistent nausea, vomiting, or diarrhea.  You have a bad smelling vaginal discharge.  You have pain when you urinate.  You notice increased swelling in your face, hands, legs, or ankles.  You are exposed to fifth disease or chickenpox.  You are exposed to German measles (rubella) and have never had it. Get help right away if:  You have a fever.  You are leaking fluid from your vagina.  You have spotting or bleeding from your vagina.  You have severe abdominal cramping or pain.  You have rapid weight gain or loss.  You vomit blood or material that looks like coffee grounds.  You develop a severe headache.  You have shortness of breath.  You have any kind of trauma, such as from a fall or a car accident. Summary  The first trimester of pregnancy is from week 1 until the end of week 13 (months 1 through 3).  Your body goes through many changes during pregnancy. The changes vary from  woman to woman.  You will have routine prenatal visits. During those visits, your health care provider will examine you, discuss any test results you may have, and talk with you about how you are feeling. This information is not intended to replace advice given to you by your health care provider. Make sure you discuss any questions you have with your health care provider. Document Released: 08/17/2001 Document Revised: 08/04/2016 Document Reviewed: 08/04/2016 Elsevier Interactive Patient Education  2018 Elsevier   Inc.  

## 2017-10-14 NOTE — Progress Notes (Signed)
Rob Dating scan  Early GTT No urine received, used BR in U/S

## 2017-10-14 NOTE — Progress Notes (Signed)
  HPI: Early pregnancy, no complaints  Ultrasound demonstrates Gest sac, no fetal pole These findings are early pregnancy vs blighted uvum  PMHx: She  has a past medical history of Obesity. Also,  has a past surgical history that includes No past surgeries., family history is not on file.,  reports that  has never smoked. she has never used smokeless tobacco. She reports that she drinks alcohol. She reports that she does not use drugs.  She currently has no medications in their medication list. Also, is allergic to other.  ROS all neg  Objective: BP 118/72   Wt (!) 322 lb (146.1 kg)   LMP 08/21/2017   BMI 47.55 kg/m   Physical examination Constitutional NAD, Conversant  Skin No rashes, lesions or ulceration.   Extremities: Moves all appropriately.  Normal ROM for age. No lymphadenopathy.  Neuro: Grossly intact  Psych: Oriented to PPT.  Normal mood. Normal affect.   No results found.  Assessment:  Early stage of pregnancy - Plan: Beta HCG, Quant Glucola, other labs. US again 2 weeks Monitor for s/sx bleeidng or pain  Annamarie MajorPaul Tiahna Cure, MD, Merlinda FrederickFACOG [facility], [facility] 10/14/2017  12:26 PM

## 2017-10-15 LAB — RPR+RH+ABO+RUB AB+AB SCR+CB...
ANTIBODY SCREEN: NEGATIVE
HEMATOCRIT: 37.5 % (ref 34.0–46.6)
HEMOGLOBIN: 12.8 g/dL (ref 11.1–15.9)
HIV Screen 4th Generation wRfx: NONREACTIVE
Hepatitis B Surface Ag: NEGATIVE
MCH: 30.9 pg (ref 26.6–33.0)
MCHC: 34.1 g/dL (ref 31.5–35.7)
MCV: 91 fL (ref 79–97)
Platelets: 251 10*3/uL (ref 150–379)
RBC: 4.14 x10E6/uL (ref 3.77–5.28)
RDW: 13.5 % (ref 12.3–15.4)
RPR Ser Ql: NONREACTIVE
RUBELLA: 2.6 {index} (ref >0.99)
Rh Factor: POSITIVE
Varicella zoster IgG: 2274 index (ref >165)
WBC: 7.8 10*3/uL (ref 3.4–10.8)

## 2017-10-15 LAB — GLUCOSE, 1 HOUR GESTATIONAL: Gestational Diabetes Screen: 116 mg/dL (ref 65–139)

## 2017-10-15 LAB — BETA HCG QUANT (REF LAB): hCG Quant: 5513 m[IU]/mL

## 2017-10-15 NOTE — Progress Notes (Signed)
Left message w reassuring results

## 2017-10-17 ENCOUNTER — Other Ambulatory Visit: Payer: Self-pay | Admitting: Maternal Newborn

## 2017-10-17 DIAGNOSIS — Z3480 Encounter for supervision of other normal pregnancy, unspecified trimester: Secondary | ICD-10-CM

## 2017-10-19 ENCOUNTER — Other Ambulatory Visit: Payer: Self-pay | Admitting: Advanced Practice Midwife

## 2017-10-19 DIAGNOSIS — Z3491 Encounter for supervision of normal pregnancy, unspecified, first trimester: Secondary | ICD-10-CM

## 2017-10-22 ENCOUNTER — Other Ambulatory Visit: Payer: Self-pay

## 2017-10-22 ENCOUNTER — Emergency Department
Admission: EM | Admit: 2017-10-22 | Discharge: 2017-10-22 | Disposition: A | Discharge status: Home / Self Care | Payer: Medicaid Other | Attending: Emergency Medicine | Admitting: Emergency Medicine

## 2017-10-22 ENCOUNTER — Encounter: Payer: Self-pay | Admitting: Emergency Medicine

## 2017-10-22 DIAGNOSIS — O210 Mild hyperemesis gravidarum: Secondary | ICD-10-CM | POA: Diagnosis not present

## 2017-10-22 DIAGNOSIS — Z3A01 Less than 8 weeks gestation of pregnancy: Secondary | ICD-10-CM | POA: Diagnosis not present

## 2017-10-22 DIAGNOSIS — O219 Vomiting of pregnancy, unspecified: Secondary | ICD-10-CM | POA: Diagnosis present

## 2017-10-22 DIAGNOSIS — O9989 Other specified diseases and conditions complicating pregnancy, childbirth and the puerperium: Secondary | ICD-10-CM | POA: Diagnosis not present

## 2017-10-22 DIAGNOSIS — R1010 Upper abdominal pain, unspecified: Secondary | ICD-10-CM | POA: Diagnosis not present

## 2017-10-22 LAB — URINALYSIS, COMPLETE (UACMP) WITH MICROSCOPIC
Bacteria, UA: NONE SEEN
Bilirubin Urine: NEGATIVE
GLUCOSE, UA: NEGATIVE mg/dL
HGB URINE DIPSTICK: NEGATIVE
KETONES UR: NEGATIVE mg/dL
Leukocytes, UA: NEGATIVE
NITRITE: NEGATIVE
PH: 6 (ref 5.0–8.0)
PROTEIN: NEGATIVE mg/dL
Specific Gravity, Urine: 1.019 (ref 1.005–1.030)

## 2017-10-22 LAB — COMPREHENSIVE METABOLIC PANEL
ALK PHOS: 65 U/L (ref 38–126)
ALT: 11 U/L — ABNORMAL LOW (ref 14–54)
ANION GAP: 8 (ref 5–15)
AST: 17 U/L (ref 15–41)
Albumin: 3.4 g/dL — ABNORMAL LOW (ref 3.5–5.0)
BILIRUBIN TOTAL: 0.5 mg/dL (ref 0.3–1.2)
BUN: 13 mg/dL (ref 6–20)
CALCIUM: 8.6 mg/dL — AB (ref 8.9–10.3)
CO2: 22 mmol/L (ref 22–32)
CREATININE: 0.76 mg/dL (ref 0.44–1.00)
Chloride: 107 mmol/L (ref 101–111)
Glucose, Bld: 133 mg/dL — ABNORMAL HIGH (ref 65–99)
Potassium: 3.8 mmol/L (ref 3.5–5.1)
SODIUM: 137 mmol/L (ref 135–145)
TOTAL PROTEIN: 6.9 g/dL (ref 6.5–8.1)

## 2017-10-22 LAB — CBC
HCT: 41.2 % (ref 35.0–47.0)
HEMOGLOBIN: 14 g/dL (ref 12.0–16.0)
MCH: 31.1 pg (ref 26.0–34.0)
MCHC: 33.9 g/dL (ref 32.0–36.0)
MCV: 91.6 fL (ref 80.0–100.0)
PLATELETS: 272 10*3/uL (ref 150–440)
RBC: 4.5 MIL/uL (ref 3.80–5.20)
RDW: 13.5 % (ref 11.5–14.5)
WBC: 9.2 10*3/uL (ref 3.6–11.0)

## 2017-10-22 LAB — HCG, QUANTITATIVE, PREGNANCY: hCG, Beta Chain, Quant, S: 28363 m[IU]/mL — ABNORMAL HIGH (ref <5)

## 2017-10-22 LAB — LIPASE, BLOOD: Lipase: 27 U/L (ref 11–51)

## 2017-10-22 MED ORDER — SODIUM CHLORIDE 0.9 % IV BOLUS (SEPSIS): 1000.0000 mL | Freq: Once | INTRAVENOUS | Status: DC

## 2017-10-22 NOTE — ED Notes (Signed)

## 2017-10-22 NOTE — Discharge Instructions (Signed)
please follow-up with your OB/GYN as planned. Please return for any worse pain or inability to keep down fluids. Also return for fever or any other problems.

## 2017-10-22 NOTE — ED Provider Notes (Addendum)
[facility] Emergency Department Provider Note   ____________________________________________   First MD Initiated Contact with Patient 10/22/17 [phone]     (approximate)  I have reviewed the triage vital signs and the nursing notes.   HISTORY  Chief Complaint Emesis and Abdominal Pain    HPI Patricia Vazquez is a 32 y.o. female Patient reports this is her third pregnancy she is [redacted] weeks pregnant. She's been having some morning sickness but this morning she began having some vomiting and upper abdominal pain and pain around the umbilicus beginning about 3:00. She has no lower abdominal pain or cramping no discharge no bleeding no dysuria no fever no chills she is not coughing she's been waiting for a little bit and the pain and the nausea has resolved. She reports to morning sickness she is usually able to use ginger ale and crackers and she is fine. She has no OB doctor appointment for further follow-up and will be using it.   Past Medical History:  Diagnosis Date  . Obesity     Patient Active Problem List   Diagnosis Date Noted  . Supervision of other normal pregnancy, antepartum 10/12/2017  . BMI 45.0-49.9, adult (HCC) 10/12/2017  . Obesity affecting pregnancy 10/12/2017    Past Surgical History:  Procedure Laterality Date  . NO PAST SURGERIES      Prior to Admission medications   Not on File    Allergies Other  History reviewed. No pertinent family history.  Social History Social History   Tobacco Use  . Smoking status: Never Smoker  . Smokeless tobacco: Never Used  Substance Use Topics  . Alcohol use: Yes  . Drug use: No    Review of Systems  Constitutional: No fever/chills Eyes: No visual changes. ENT: No sore throat. Cardiovascular: Denies chest pain. Respiratory: Denies shortness of breath. Gastrointestinal:see history of present illness. Genitourinary: Negative for dysuria. Musculoskeletal: Negative for back  pain. Skin: Negative for rash. Neurological: Negative for headaches, focal weakness   ____________________________________________   PHYSICAL EXAM:  VITAL SIGNS: ED Triage Vitals  Enc Vitals Group     BP 10/22/17 0621 (!) 129/95     Pulse Rate 10/22/17 0621 78     Resp 10/22/17 0621 16     Temp 10/22/17 0621 98.4 F (36.9 C)     Temp Source 10/22/17 0621 Oral     SpO2 10/22/17 0621 100 %     Weight 10/22/17 0620 (!) 320 lb (145.2 kg)     Height 10/22/17 0620 5\' 9"  (1.753 m)     Head Circumference --      Peak Flow --      Pain Score 10/22/17 0619 5     Pain Loc --      Pain Edu? --      Excl. in GC? --    Constitutional: Alert and oriented. Well appearing and in no acute distress. Eyes: Conjunctivae are normal.  Head: Atraumatic. Nose: No congestion/rhinnorhea. Mouth/Throat: Mucous membranes are moist.  Oropharynx non-erythematous. Neck: No stridor.  Cardiovascular: Grossly normal heart sounds.  Good peripheral circulation. Respiratory: Normal respiratory effort.  No retractions. Lungs CTAB. Gastrointestinal: Soft and nontender. No distention. No abdominal bruits. No CVA tenderness. Musculoskeletal: No lower extremity tenderness nor edema.  Neurologic:  Normal speech and language. No gross focal neurologic deficits are appreciated.  Skin:  Skin is warm, dry and intact. No rash noted. Psychiatric: Mood and affect are normal. Speech and behavior are normal.  ____________________________________________  LABS (all labs ordered are listed, but only abnormal results are displayed)  Labs Reviewed  COMPREHENSIVE METABOLIC PANEL - Abnormal; Notable for the following components:      Result Value   Glucose, Bld 133 (*)    Calcium 8.6 (*)    Albumin 3.4 (*)    ALT 11 (*)    All other components within normal limits  URINALYSIS, COMPLETE (UACMP) WITH MICROSCOPIC - Abnormal; Notable for the following components:   Color, Urine YELLOW (*)    APPearance CLEAR (*)     Squamous Epithelial / LPF 0-5 (*)    All other components within normal limits  HCG, QUANTITATIVE, PREGNANCY - Abnormal; Notable for the following components:   hCG, Beta Chain, Quant, S 16,109 (*)    All other components within normal limits  LIPASE, BLOOD  CBC   ____________________________________________  EKG EKG read and interpreted by me shows normal sinus rhythm rate of 89 normal axis no acute ST-T wave changes  ____________________________________________  RADIOLOGY  ED MD interpretation:  Official radiology report(s): No results found.  ____________________________________________   PROCEDURES  Procedure(s) performed:   Procedures  Critical Care performed:   ____________________________________________   INITIAL IMPRESSION / ASSESSMENT AND PLAN / ED COURSE  patient feeling better now has no further pain is not nauseated any longer she has felt with her OB showed a recent ultrasound which showed a very early pregnancy.         ____________________________________________   FINAL CLINICAL IMPRESSION(S) / ED DIAGNOSES  Final diagnoses:  Morning sickness     ED Discharge Orders    None       Note:  This document was prepared using Dragon voice recognition software and may include unintentional dictation errors.    Arnaldo Natal, MD 10/22/17 6045    Arnaldo Natal, MD 10/22/17 [phone]

## 2017-10-22 NOTE — ED Triage Notes (Signed)
Pt states is [redacted] weeks pregnant with emesis and abd pain around umbilicus that began this am at 0300. Pt denies fever, chills. Pt denies vaginal bleeding, discharge.

## 2017-10-28 ENCOUNTER — Encounter: Payer: Self-pay | Admitting: Advanced Practice Midwife

## 2017-10-28 ENCOUNTER — Ambulatory Visit (INDEPENDENT_AMBULATORY_CARE_PROVIDER_SITE_OTHER): Payer: Medicaid Other | Admitting: Advanced Practice Midwife

## 2017-10-28 ENCOUNTER — Ambulatory Visit (INDEPENDENT_AMBULATORY_CARE_PROVIDER_SITE_OTHER): Payer: Medicaid Other

## 2017-10-28 VITALS — BP 122/84 | Wt 316.0 lb

## 2017-10-28 DIAGNOSIS — Z3491 Encounter for supervision of normal pregnancy, unspecified, first trimester: Secondary | ICD-10-CM | POA: Diagnosis not present

## 2017-10-28 DIAGNOSIS — O219 Vomiting of pregnancy, unspecified: Secondary | ICD-10-CM

## 2017-10-28 DIAGNOSIS — Z3A01 Less than 8 weeks gestation of pregnancy: Secondary | ICD-10-CM

## 2017-10-28 MED ORDER — DOXYLAMINE-PYRIDOXINE 10-10 MG PO TBEC
2.0000 | DELAYED_RELEASE_TABLET | Freq: Every day | ORAL | 5 refills | Status: DC
Start: 2017-10-28 — End: 2018-01-23

## 2017-10-28 NOTE — Patient Instructions (Signed)
Morning Sickness °Morning sickness is when you feel sick to your stomach (nauseous) during pregnancy. This nauseous feeling may or may not come with vomiting. It often occurs in the morning but can be a problem any time of day. Morning sickness is most common during the first trimester, but it may continue throughout pregnancy. While morning sickness is unpleasant, it is usually harmless unless you develop severe and continual vomiting (hyperemesis gravidarum). This condition requires more intense treatment. °What are the causes? °The cause of morning sickness is not completely known but seems to be related to normal hormonal changes that occur in pregnancy. °What increases the risk? °You are at greater risk if you: °· Experienced nausea or vomiting before your pregnancy. °· Had morning sickness during a previous pregnancy. °· Are pregnant with more than one baby, such as twins. ° °How is this treated? °Do not use any medicines (prescription, over-the-counter, or herbal) for morning sickness without first talking to your health care provider. Your health care provider may prescribe or recommend: °· Vitamin B6 supplements. °· Anti-nausea medicines. °· The herbal medicine ginger. ° °Follow these instructions at home: °· Only take over-the-counter or prescription medicines as directed by your health care provider. °· Taking multivitamins before getting pregnant can prevent or decrease the severity of morning sickness in most women. °· Eat a piece of dry toast or unsalted crackers before getting out of bed in the morning. °· Eat five or six small meals a day. °· Eat dry and bland foods (rice, baked potato). Foods high in carbohydrates are often helpful. °· Do not drink liquids with your meals. Drink liquids between meals. °· Avoid greasy, fatty, and spicy foods. °· Get someone to cook for you if the smell of any food causes nausea and vomiting. °· If you feel nauseous after taking prenatal vitamins, take the vitamins at  night or with a snack. °· Snack on protein foods (nuts, yogurt, cheese) between meals if you are hungry. °· Eat unsweetened gelatins for desserts. °· Wearing an acupressure wristband (worn for sea sickness) may be helpful. °· Acupuncture may be helpful. °· Do not smoke. °· Get a humidifier to keep the air in your house free of odors. °· Get plenty of fresh air. °Contact a health care provider if: °· Your home remedies are not working, and you need medicine. °· You feel dizzy or lightheaded. °· You are losing weight. °Get help right away if: °· You have persistent and uncontrolled nausea and vomiting. °· You pass out (faint). °This information is not intended to replace advice given to you by your health care provider. Make sure you discuss any questions you have with your health care provider. °Document Released: 10/14/2006 Document Revised: 01/29/2016 Document Reviewed: 02/07/2013 °Elsevier Interactive Patient Education © 2017 Elsevier Inc. ° °

## 2017-10-28 NOTE — Progress Notes (Signed)
U/s today. No vb. No lof.  

## 2017-10-28 NOTE — Progress Notes (Signed)
  Routine Prenatal Care Visit  Subjective  Patricia Vazquez is a 32 y.o. G3P2002 at 756w1d being seen today for ongoing prenatal care.  She is currently monitored for the following issues for this high-risk pregnancy and has Supervision of other high risk pregnancy, antepartum; BMI 45.0-49.9, adult (HCC); and Obesity affecting pregnancy on their problem list.  ----------------------------------------------------------------------------------- Patient reports nausea. Comfort measures reviewed.    . Vag. Bleeding: None.   . Denies leaking of fluid.  ----------------------------------------------------------------------------------- The following portions of the patient's history were reviewed and updated as appropriate: allergies, current medications, past family history, past medical history, past social history, past surgical history and problem list. Problem list updated.   Objective  Blood pressure 122/84, weight (!) 316 lb (143.3 kg), last menstrual period 08/21/2017. Pregravid weight 320 lb (145.2 kg) Total Weight Gain  (-1.814 kg) Urinalysis:      Fetal Status: Fetal Heart Rate (bpm): 147         Dating scan today: EDD adjusted for almost 3 week difference with LMP  General:  Alert, oriented and cooperative. Patient is in no acute distress.  Skin: Skin is warm and dry. No rash noted.   Cardiovascular: Normal heart rate noted  Respiratory: Normal respiratory effort, no problems with respiration noted  Abdomen: Soft, gravid, appropriate for gestational age.       Pelvic:  Cervical exam deferred        Extremities: Normal range of motion.     Mental Status: Normal mood and affect. Normal behavior. Normal judgment and thought content.   Assessment   32 y.o. G3P2002 at 376w1d by  06/15/2018, by Ultrasound presenting for routine prenatal visit  Plan    THIRD Problems (from 10/12/17 to present)    Problem Noted Resolved   Supervision of other normal pregnancy, antepartum 10/12/2017 by  Oswaldo ConroySchmid, Jacelyn Y, CNM No   Overview Addendum 10/17/2017  3:49 PM by Oswaldo ConroySchmid, Jacelyn Y, CNM    Clinic [facility] Prenatal Labs  Dating  Blood type: O/Positive/-- (02/08 1157)   Genetic Screen Declines Antibody:Negative (02/08 1157)  Anatomic US  Rubella: 2.60 (02/08 1157) Varicella: Immune  GTT Early: 116              Third trimester:  RPR: Non Reactive (02/08 1157)   Rhogam  HBsAg: Negative (02/08 1157)   TDaP vaccine                       Flu Shot: HIV: Non Reactive (02/08 1157)   Baby Food                                GBS:   Contraception  Pap: 08/19/17, NILM/HPV Negative  CBB     CS/VBAC    Support Person              Obesity affecting pregnancy 10/12/2017 by Oswaldo ConroySchmid, Jacelyn Y, CNM No       Preterm labor symptoms and general obstetric precautions including but not limited to vaginal bleeding, contractions, leaking of fluid and fetal movement were reviewed in detail with the patient. Please refer to After Visit Summary for other counseling recommendations.   Rx sent for Diclegis  Return in about 4 weeks (around 11/25/2017) for rob.  Tresea MallJane Matalie Romberger, CNM  10/28/2017 10:54 AM

## 2017-11-10 ENCOUNTER — Other Ambulatory Visit: Payer: Self-pay

## 2017-11-10 ENCOUNTER — Ambulatory Visit
Admission: EM | Admit: 2017-11-10 | Discharge: 2017-11-10 | Disposition: A | Discharge status: Home / Self Care | Payer: Medicaid Other | Attending: Family Medicine | Admitting: Family Medicine

## 2017-11-10 DIAGNOSIS — Z331 Pregnant state, incidental: Secondary | ICD-10-CM

## 2017-11-10 DIAGNOSIS — R112 Nausea with vomiting, unspecified: Secondary | ICD-10-CM

## 2017-11-10 DIAGNOSIS — G43019 Migraine without aura, intractable, without status migrainosus: Secondary | ICD-10-CM | POA: Diagnosis not present

## 2017-11-10 NOTE — ED Triage Notes (Signed)
Patient complains of migraine that started yesterday with nausea. Patient states that she has tried caffeine, sleep and tylenol extra strength. Patient states that is [redacted] weeks pregnant currently.

## 2017-11-10 NOTE — ED Provider Notes (Signed)
[facility]    CSN: [id number] Arrival date & time: 11/10/17  1506     History   Chief Complaint Chief Complaint  Patient presents with  . Migraine    HPI Patricia Vazquez is a 32 y.o. female.   HPI  32 year old female presents with a migraine that started yesterday.  She has had nausea and vomiting associated with it.  She is also complaining of photophobia.  She states that this is 1 of the worst headache she is ever had.  So far she has tried caffeine,sleep and Tylenol.  None of this has been effective for her. This is her third child.  She has no history of preeclampsia.  Pressure today is 121/75.  She is morbidly obese.      Past Medical History:  Diagnosis Date  . Obesity     Patient Active Problem List   Diagnosis Date Noted  . Supervision of high risk pregnancy, antepartum 10/12/2017  . BMI 45.0-49.9, adult (HCC) 10/12/2017  . Obesity affecting pregnancy 10/12/2017    Past Surgical History:  Procedure Laterality Date  . NO PAST SURGERIES      OB History    Gravida Para Term Preterm AB Living   3 2 2     2    SAB TAB Ectopic Multiple Live Births           2       Home Medications    Prior to Admission medications   Medication Sig Start Date End Date Taking? Authorizing Provider  Doxylamine-Pyridoxine (DICLEGIS) 10-10 MG TBEC Take 2 tablets by mouth at bedtime. If symptoms persist, add one tablet in the morning and one in the afternoon 10/28/17   Tresea MallGledhill, Jane, CNM    Family History History reviewed. No pertinent family history.  Social History Social History   Tobacco Use  . Smoking status: Never Smoker  . Smokeless tobacco: Never Used  Substance Use Topics  . Alcohol use: No    Frequency: Never    Comment: currently  . Drug use: No     Allergies   Other   Review of Systems Review of Systems  Constitutional: Positive for activity change and appetite change. Negative for chills, fatigue and fever.  Neurological:  Positive for headaches.  All other systems reviewed and are negative.    Physical Exam Triage Vital Signs ED Triage Vitals  Enc Vitals Group     BP 11/10/17 1539 121/75     Pulse Rate 11/10/17 1539 84     Resp 11/10/17 1539 18     Temp 11/10/17 1539 98.3 F (36.8 C)     Temp Source 11/10/17 1539 Oral     SpO2 11/10/17 1539 99 %     Weight 11/10/17 1536 (!) 321 lb (145.6 kg)     Height 11/10/17 1536 5\' 9"  (1.753 m)     Head Circumference --      Peak Flow --      Pain Score 11/10/17 1535 8     Pain Loc --      Pain Edu? --      Excl. in GC? --    No data found.  Updated Vital Signs BP 121/75 (BP Location: Left Arm)   Pulse 84   Temp 98.3 F (36.8 C) (Oral)   Resp 18   Ht 5\' 9"  (1.753 m)   Wt (!) 321 lb (145.6 kg)   LMP 08/21/2017   SpO2 99%   BMI 47.40 kg/m  Visual Acuity Right Eye Distance:   Left Eye Distance:   Bilateral Distance:    Right Eye Near:   Left Eye Near:    Bilateral Near:     Physical Exam  Constitutional: She is oriented to person, place, and time. She appears well-developed and well-nourished. No distress.  HENT:  Head: Normocephalic.  Eyes: Pupils are equal, round, and reactive to light.  Neck: Normal range of motion.  Musculoskeletal: Normal range of motion.  Neurological: She is alert and oriented to person, place, and time. No cranial nerve deficit. She exhibits normal muscle tone. Coordination normal.  Skin: Skin is warm and dry. She is not diaphoretic.  Psychiatric: She has a normal mood and affect. Her behavior is normal. Thought content normal.  Nursing note and vitals reviewed.    UC Treatments / Results  Labs (all labs ordered are listed, but only abnormal results are displayed) Labs Reviewed - No data to display  EKG  EKG Interpretation None       Radiology No results found.  Procedures Procedures (including critical care time)  Medications Ordered in UC Medications - No data to display   Initial  Impression / Assessment and Plan / UC Course  I have reviewed the triage vital signs and the nursing notes.  Pertinent labs & imaging results that were available during my care of the patient were reviewed by me and considered in my medical decision making (see chart for details).     After discussion with the patient I have told her that there is nothing more that she has tried at home that I can her administer from our facility.  If she went to the emergency room they may be able to give her another medication.  She states that with her second child she had a very similar headache and was given a shot with complete resolution quickly.  Reviewed her previous medical records but could not find any notation since it had been too long ago.  He is agreeable to going to the emergency room for further evaluation and care.  Left our facility in stable condition and was going to be transported by privately owned vehicle.  Final Clinical Impressions(s) / UC Diagnoses   Final diagnoses:  Intractable migraine without aura and without status migrainosus    ED Discharge Orders    None       Controlled Substance Prescriptions [place] Controlled Substance Registry consulted? Not Applicable   Lutricia Feil, PA-C 11/10/17 1645

## 2017-11-11 ENCOUNTER — Telehealth: Payer: Self-pay

## 2017-11-11 ENCOUNTER — Other Ambulatory Visit: Payer: Self-pay | Admitting: Maternal Newborn

## 2017-11-11 DIAGNOSIS — R51 Headache: Principal | ICD-10-CM

## 2017-11-11 DIAGNOSIS — O26891 Other specified pregnancy related conditions, first trimester: Secondary | ICD-10-CM

## 2017-11-11 MED ORDER — BUTALBITAL-APAP-CAFFEINE 50-325-40 MG PO TABS: 1.0000 | ORAL_TABLET | Freq: Four times a day (QID) | ORAL | 0 refills | Status: DC | PRN

## 2017-11-11 NOTE — Telephone Encounter (Signed)
Pt just left Urgent Care in [place] having a severe migraine w/nausea. Urgent Care wouldn't give her anything for it & advised her to contact her OB GYN. Pt inquiring what she can take other than extra strength Tylenol. ZO#[phone]#[phone]

## 2017-11-21 ENCOUNTER — Telehealth: Payer: Self-pay

## 2017-11-21 NOTE — Telephone Encounter (Signed)
Pt was tx'd to me from [facility].  Pt c/o migraine.  Is taking e.s. Tylenol.  Adv may take two e.s. Tylenol q4h, is allowed 16oz of caffeine a day including chocolate.  Adv it may take more than one dose.  To d/w prvider on 22nd appt.

## 2017-11-25 ENCOUNTER — Ambulatory Visit (INDEPENDENT_AMBULATORY_CARE_PROVIDER_SITE_OTHER): Payer: Medicaid Other

## 2017-11-25 ENCOUNTER — Other Ambulatory Visit: Payer: Self-pay | Admitting: Advanced Practice Midwife

## 2017-11-25 ENCOUNTER — Ambulatory Visit (INDEPENDENT_AMBULATORY_CARE_PROVIDER_SITE_OTHER): Payer: Medicaid Other | Admitting: Advanced Practice Midwife

## 2017-11-25 ENCOUNTER — Encounter: Payer: Self-pay | Admitting: Advanced Practice Midwife

## 2017-11-25 VITALS — BP 120/80 | Wt 321.0 lb

## 2017-11-25 DIAGNOSIS — Z3A11 11 weeks gestation of pregnancy: Secondary | ICD-10-CM

## 2017-11-25 DIAGNOSIS — O0990 Supervision of high risk pregnancy, unspecified, unspecified trimester: Secondary | ICD-10-CM

## 2017-11-25 NOTE — Progress Notes (Signed)
C/o migraines - is doing e.s. Tylenol 2-3 every 6hrs a little bit of caffeine and cold cloth to eyes.  Adv e.s. Tylenol two q4h while awake, 16oz caffeine and cold cloth.

## 2017-11-25 NOTE — Patient Instructions (Signed)
Migraine Headache A migraine headache is an intense, throbbing pain on one side or both sides of the head. Migraines may also cause other symptoms, such as nausea, vomiting, and sensitivity to light and noise. What are the causes? Doing or taking certain things may also trigger migraines, such as:  Alcohol.  Smoking.  Medicines, such as: ? Medicine used to treat chest pain (nitroglycerine). ? Birth control pills. ? Estrogen pills. ? Certain blood pressure medicines.  Aged cheeses, chocolate, or caffeine.  Foods or drinks that contain nitrates, glutamate, aspartame, or tyramine.  Physical activity.  Other things that may trigger a migraine include:  Menstruation.  Pregnancy.  Hunger.  Stress, lack of sleep, too much sleep, or fatigue.  Weather changes.  What increases the risk? The following factors may make you more likely to experience migraine headaches:  Age. Risk increases with age.  Family history of migraine headaches.  Being Caucasian.  Depression and anxiety.  Obesity.  Being a woman.  Having a hole in the heart (patent foramen ovale) or other heart problems.  What are the signs or symptoms? The main symptom of this condition is pulsating or throbbing pain. Pain may:  Happen in any area of the head, such as on one side or both sides.  Interfere with daily activities.  Get worse with physical activity.  Get worse with exposure to bright lights or loud noises.  Other symptoms may include:  Nausea.  Vomiting.  Dizziness.  General sensitivity to bright lights, loud noises, or smells.  Before you get a migraine, you may get warning signs that a migraine is developing (aura). An aura may include:  Seeing flashing lights or having blind spots.  Seeing bright spots, halos, or zigzag lines.  Having tunnel vision or blurred vision.  Having numbness or a tingling feeling.  Having trouble talking.  Having muscle weakness.  How is this  diagnosed? A migraine headache can be diagnosed based on:  Your symptoms.  A physical exam.  Tests, such as CT scan or MRI of the head. These imaging tests can help rule out other causes of headaches.  Taking fluid from the spine (lumbar puncture) and analyzing it (cerebrospinal fluid analysis, or CSF analysis).  How is this treated? A migraine headache is usually treated with medicines that:  Relieve pain.  Relieve nausea.  Prevent migraines from coming back.  Treatment may also include:  Acupuncture.  Lifestyle changes like avoiding foods that trigger migraines.  Follow these instructions at home: Medicines  Take over-the-counter and prescription medicines only as told by your health care provider.  Do not drive or use heavy machinery while taking prescription pain medicine.  To prevent or treat constipation while you are taking prescription pain medicine, your health care provider may recommend that you: ? Drink enough fluid to keep your urine clear or pale yellow. ? Take over-the-counter or prescription medicines. ? Eat foods that are high in fiber, such as fresh fruits and vegetables, whole grains, and beans. ? Limit foods that are high in fat and processed sugars, such as fried and sweet foods. Lifestyle  Avoid alcohol use.  Do not use any products that contain nicotine or tobacco, such as cigarettes and e-cigarettes. If you need help quitting, ask your health care provider.  Get at least 8 hours of sleep every night.  Limit your stress. General instructions   Keep a journal to find out what may trigger your migraine headaches. For example, write down: ? What you eat and   drink. ? How much sleep you get. ? Any change to your diet or medicines.  If you have a migraine: ? Avoid things that make your symptoms worse, such as bright lights. ? It may help to lie down in a dark, quiet room. ? Do not drive or use heavy machinery. ? Ask your health care provider  what activities are safe for you while you are experiencing symptoms.  Keep all follow-up visits as told by your health care provider. This is important. Contact a health care provider if:  You develop symptoms that are different or more severe than your usual migraine symptoms. Get help right away if:  Your migraine becomes severe.  You have a fever.  You have a stiff neck.  You have vision loss.  Your muscles feel weak or like you cannot control them.  You start to lose your balance often.  You develop trouble walking.  You faint. This information is not intended to replace advice given to you by your health care provider. Make sure you discuss any questions you have with your health care provider. Document Released: 08/23/2005 Document Revised: 03/12/2016 Document Reviewed: 02/09/2016 Elsevier Interactive Patient Education  2017 Elsevier Inc.   

## 2017-11-25 NOTE — Progress Notes (Signed)
Routine Prenatal Care Visit  Subjective  Patricia Vazquez is a 32 y.o. G3P2002 at 671w1d being seen today for ongoing prenatal care.  She is currently monitored for the following issues for this high-risk pregnancy and has Supervision of high risk pregnancy, antepartum; BMI 45.0-49.9, adult (HCC); and Obesity affecting pregnancy on their problem list.  ----------------------------------------------------------------------------------- Patient reports headache.  She is getting relief with 2 extra strength tylenol every 4 hours x 2 doses. Discussed need for consult with anesthesia in the next few weeks for BMI >45.  . Vag. Bleeding: None.   . Denies leaking of fluid.  ----------------------------------------------------------------------------------- The following portions of the patient's history were reviewed and updated as appropriate: allergies, current medications, past family history, past medical history, past social history, past surgical history and problem list. Problem list updated.   Objective  Blood pressure 120/80, weight (!) 321 lb (145.6 kg), last menstrual period 08/21/2017. Pregravid weight 320 lb (145.2 kg) Total Weight Gain 1 lb (0.454 kg) Urinalysis: Urine Protein: Trace Urine Glucose: Negative  Fetal Status: Fetal Heart Rate (bpm): 164 by ultrasound- unable to find FHR with hand held doppler due to body habitus  General:  Alert, oriented and cooperative. Patient is in no acute distress.  Skin: Skin is warm and dry. No rash noted.   Cardiovascular: Normal heart rate noted  Respiratory: Normal respiratory effort, no problems with respiration noted  Abdomen: Soft, gravid, appropriate for gestational age.       Pelvic:  Cervical exam deferred        Extremities: Normal range of motion.     Mental Status: Normal mood and affect. Normal behavior. Normal judgment and thought content.   Assessment   32 y.o. G3P2002 at 741w1d by  06/15/2018, by Ultrasound presenting for  routine prenatal visit  Plan   THIRD Problems (from 10/12/17 to present)    Problem Noted Resolved   Supervision of high risk pregnancy, antepartum 10/12/2017 by Oswaldo ConroySchmid, Jacelyn Y, CNM No   Overview Addendum 10/17/2017  3:49 PM by Oswaldo ConroySchmid, Jacelyn Y, CNM    Clinic [facility] Prenatal Labs  Dating  Blood type: O/Positive/-- (02/08 1157)   Genetic Screen Declines Antibody:Negative (02/08 1157)  Anatomic US  Rubella: 2.60 (02/08 1157) Varicella: Immune  GTT Early: 116              Third trimester:  RPR: Non Reactive (02/08 1157)   Rhogam  HBsAg: Negative (02/08 1157)   TDaP vaccine                       Flu Shot: HIV: Non Reactive (02/08 1157)   Baby Food                                GBS:   Contraception  Pap: 08/19/17, NILM/HPV Negative  CBB     CS/VBAC    Support Person              Obesity affecting pregnancy 10/12/2017 by Oswaldo ConroySchmid, Jacelyn Y, CNM No       Preterm labor symptoms and general obstetric precautions including but not limited to vaginal bleeding, contractions, leaking of fluid and fetal movement were reviewed in detail with the patient. Please refer to After Visit Summary for other counseling recommendations.  Referral for anesthesia consult request sent  Return in about 1 month (around 12/23/2017) for rob.  Tresea MallJane Raihana Balderrama, CNM 11/25/2017 2:20 PM

## 2017-12-06 ENCOUNTER — Encounter
Admission: RE | Admit: 2017-12-06 | Discharge: 2017-12-06 | Disposition: A | Discharge status: Home / Self Care | Payer: Medicaid Other | Source: Ambulatory Visit | Attending: Advanced Practice Midwife | Admitting: Advanced Practice Midwife

## 2017-12-22 ENCOUNTER — Ambulatory Visit (INDEPENDENT_AMBULATORY_CARE_PROVIDER_SITE_OTHER): Payer: Medicaid Other | Admitting: Advanced Practice Midwife

## 2017-12-22 ENCOUNTER — Encounter: Payer: Self-pay | Admitting: Advanced Practice Midwife

## 2017-12-22 VITALS — BP 118/70 | Wt 319.0 lb

## 2017-12-22 DIAGNOSIS — Z3A15 15 weeks gestation of pregnancy: Secondary | ICD-10-CM

## 2017-12-22 NOTE — Progress Notes (Signed)
ROB

## 2017-12-22 NOTE — Progress Notes (Signed)
Routine Prenatal Care Visit  Subjective  Patricia Vazquez is a 32 y.o. G3P2002 at 7941w0d being seen today for ongoing prenatal care.  She is currently monitored for the following issues for this high-risk pregnancy and has Supervision of high risk pregnancy, antepartum; BMI 45.0-49.9, adult (HCC); and Obesity affecting pregnancy on their problem list.  ----------------------------------------------------------------------------------- Patient reports no complaints.  Patient had initial consult with anesthesia due to elevated BMI. Recommended no weight gain or losing some weight. She is focusing on healthy foods (less sugar/bedtime snacks) and increased hydration with h2o and increased activity. She is motivated to follow plan because she does not want to deliver at a big hospital. Contractions: Not present. Vag. Bleeding: None.   . Denies leaking of fluid.  ----------------------------------------------------------------------------------- The following portions of the patient's history were reviewed and updated as appropriate: allergies, current medications, past family history, past medical history, past social history, past surgical history and problem list. Problem list updated.   Objective  Blood pressure 118/70, weight (!) 319 lb (144.7 kg), last menstrual period 08/21/2017. Pregravid weight 320 lb (145.2 kg) Total Weight Gain -1 lb (-0.454 kg) Urinalysis: Urine Protein: 1+ Urine Glucose: Negative  Fetal Status: Fetal Heart Rate (bpm): 146       unable to get with hand held doppler, used portable u/s  General:  Alert, oriented and cooperative. Patient is in no acute distress.  Skin: Skin is warm and dry. No rash noted.   Cardiovascular: Normal heart rate noted  Respiratory: Normal respiratory effort, no problems with respiration noted  Abdomen: Soft, gravid, appropriate for gestational age. Pain/Pressure: Absent     Pelvic:  Cervical exam deferred        Extremities: Normal range of  motion.     Mental Status: Normal mood and affect. Normal behavior. Normal judgment and thought content.   Assessment   32 y.o. G3P2002 at 7341w0d by  06/15/2018, by Ultrasound presenting for routine prenatal visit  Plan   THIRD Problems (from 10/12/17 to present)    Problem Noted Resolved   Supervision of high risk pregnancy, antepartum 10/12/2017 by Oswaldo ConroySchmid, Jacelyn Y, CNM No   Overview Addendum 10/17/2017  3:49 PM by Oswaldo ConroySchmid, Jacelyn Y, CNM    [facility]inic [facility] Prenatal Labs  Dating  Blood type: O/Positive/-- (02/08 1157)   Genetic Screen Declines Antibody:Negative (02/08 1157)  Anatomic US  Rubella: 2.60 (02/08 1157) Varicella: Immune  GTT Early: 116              Third trimester:  RPR: Non Reactive (02/08 1157)   Rhogam  HBsAg: Negative (02/08 1157)   TDaP vaccine                       Flu Shot: HIV: Non Reactive (02/08 1157)   Baby Food                                GBS:   Contraception  Pap: 08/19/17, NILM/HPV Negative  CBB     CS/VBAC    Support Person              Obesity affecting pregnancy 10/12/2017 by Oswaldo ConroySchmid, Jacelyn Y, CNM No       Preterm labor symptoms and general obstetric precautions including but not limited to vaginal bleeding, contractions, leaking of fluid and fetal movement were reviewed in detail with the patient.    Return in about 1 month (around  01/19/2018) for anatomy scan and rob.  Tresea Mall, CNM 12/22/2017 10:16 AM

## 2018-01-17 ENCOUNTER — Other Ambulatory Visit: Payer: Self-pay | Admitting: Obstetrics and Gynecology

## 2018-01-17 DIAGNOSIS — O0993 Supervision of high risk pregnancy, unspecified, third trimester: Secondary | ICD-10-CM

## 2018-01-23 ENCOUNTER — Ambulatory Visit (INDEPENDENT_AMBULATORY_CARE_PROVIDER_SITE_OTHER): Payer: Medicaid Other

## 2018-01-23 ENCOUNTER — Ambulatory Visit (INDEPENDENT_AMBULATORY_CARE_PROVIDER_SITE_OTHER): Payer: Medicaid Other | Admitting: Obstetrics and Gynecology

## 2018-01-23 VITALS — BP 110/72 | Wt 324.0 lb

## 2018-01-23 DIAGNOSIS — O4402 Placenta previa specified as without hemorrhage, second trimester: Secondary | ICD-10-CM

## 2018-01-23 DIAGNOSIS — Z3A19 19 weeks gestation of pregnancy: Secondary | ICD-10-CM | POA: Diagnosis not present

## 2018-01-23 DIAGNOSIS — O4400 Placenta previa specified as without hemorrhage, unspecified trimester: Secondary | ICD-10-CM | POA: Insufficient documentation

## 2018-01-23 DIAGNOSIS — O0993 Supervision of high risk pregnancy, unspecified, third trimester: Secondary | ICD-10-CM

## 2018-01-23 DIAGNOSIS — IMO0002 Reserved for concepts with insufficient information to code with codable children: Secondary | ICD-10-CM

## 2018-01-23 DIAGNOSIS — O0992 Supervision of high risk pregnancy, unspecified, second trimester: Secondary | ICD-10-CM | POA: Diagnosis not present

## 2018-01-23 DIAGNOSIS — O0990 Supervision of high risk pregnancy, unspecified, unspecified trimester: Secondary | ICD-10-CM

## 2018-01-23 DIAGNOSIS — Z0489 Encounter for examination and observation for other specified reasons: Secondary | ICD-10-CM

## 2018-01-23 MED ORDER — PROCHLORPERAZINE MALEATE 10 MG PO TABS: 10.0000 mg | ORAL_TABLET | Freq: Three times a day (TID) | ORAL | 3 refills | Status: DC | PRN

## 2018-01-23 NOTE — Progress Notes (Signed)
Routine Prenatal Care Visit  Subjective  Patricia Vazquez is a 32 y.o. G3P2002 at [redacted]w[redacted]d being seen today for ongoing prenatal care.  She is currently monitored for the following issues for this high-risk pregnancy and has Supervision of high risk pregnancy, antepartum; BMI 45.0-49.9, adult (HCC); Obesity affecting pregnancy; and Placenta previa on their problem list.  ----------------------------------------------------------------------------------- Patient reports no complaints.   Contractions: Not present. Vag. Bleeding: None.  Movement: Present. Denies leaking of fluid.  ----------------------------------------------------------------------------------- The following portions of the patient's history were reviewed and updated as appropriate: allergies, current medications, past family history, past medical history, past social history, past surgical history and problem list. Problem list updated.   Objective  Blood pressure 110/72, weight (!) 324 lb (147 kg), last menstrual period 08/21/2017. Pregravid weight 320 lb (145.2 kg) Total Weight Gain 4 lb (1.814 kg) Urinalysis: Urine Protein: Trace Urine Glucose: Negative  Fetal Status: Fetal Heart Rate (bpm): 140   Movement: Present     General:  Alert, oriented and cooperative. Patient is in no acute distress.  Skin: Skin is warm and dry. No rash noted.   Cardiovascular: Normal heart rate noted  Respiratory: Normal respiratory effort, no problems with respiration noted  Abdomen: Soft, gravid, appropriate for gestational age. Pain/Pressure: Absent     Pelvic:  Cervical exam deferred        Extremities: Normal range of motion.     ental Status: Normal mood and affect. Normal behavior. Normal judgment and thought content.   US Ob Comp + 14 Wk  Result Date: 01/23/2018 ULTRASOUND REPORT Location: [facility] Date of Service: 01/23/2018 Patient Name: Patricia Vazquez DOB: 1986-05-16 MRN: [id number] Indications:Anatomy U/S Findings:  Mason Jim intrauterine pregnancy is visualized with FHR at 139 BPM. Biometrics give an (U/S) Gestational age of [redacted]w[redacted]d and an (U/S) EDD of 06/10/2018; this correlates with the clinically established Estimated Date of Delivery: 06/15/18 Fetal presentation is Breech. EFW: 373 g (13 oz). Placenta: Anterior Placenta previa is seen. AFI: Subjectively normal. Anatomic survey is incomplete for cardiac views, PCI, ACI, diaphragm, face,gender and suboptimal for spine, and brain viws and normal; Gender - Unable to identified due to fetus position.  - Technically very difficult patient. Right Ovary is normal in appearance. Left Ovary is normal appearance. Survey of the adnexa demonstrates no adnexal masses. There is no free peritoneal fluid in the cul de sac. Impression: 1. [redacted]w[redacted]d Viable Singleton Intrauterine pregnancy by U/S. 2. (U/S) EDD is consistent with Clinically established Estimated Date of Delivery: 06/15/18 . 3. Normal Anatomy Scan Recommendations: 1.Clinical correlation with the patient's History and Physical Exam. 2. Follow up ultrasound for remaining anatomy and placenta. Mital bahen P Patel, RDMS  There is a singleton gestation with subjectively normal amniotic fluid volume. The fetal biometry correlates with established dating. Detailed evaluation of the fetal anatomy was performed.The fetal anatomical survey appears within normal limits within the resolution of ultrasound as described above but remains incomplete for the above mentioned vies.  It must be noted that a normal ultrasound is unable to rule out fetal aneuploidy.  Vena Austria, MD, FACOG [facility], [facility] 01/23/2018, 9:31 AM    Assessment   32 y.o. G3P2002 at [redacted]w[redacted]d by  06/15/2018, by Ultrasound presenting for routine prenatal visit  Plan   THIRD Problems (from 10/12/17 to present)    Problem Noted Resolved   Supervision of high risk pregnancy, antepartum 10/12/2017 by Oswaldo Conroy, CNM No   Overview Addendum  10/17/2017  3:49 PM by Oswaldo Conroy, CNM    [facility]inic [facility] Prenatal Labs  Dating 7 week [place] Blood type: O/Positive/-- (02/08 1157)   Genetic Screen Declines Antibody:Negative (02/08 1157)  Anatomic [place] Incomplete Rubella: 2.60 (02/08 1157) Varicella: Immune  GTT Early: 116              Third trimester:  RPR: Non Reactive (02/08 1157)   Rhogam  HBsAg: Negative (02/08 1157)   TDaP vaccine                       Flu Shot: HIV: Non Reactive (02/08 1157)   Baby Food                                GBS:   Contraception  Pap: 08/19/17, NILM/HPV Negative  CBB     CS/VBAC    Support Person              Obesity affecting pregnancy 10/12/2017 by Oswaldo Conroy, CNM No       Gestational age appropriate obstetric precautions including but not limited to vaginal bleeding, contractions, leaking of fluid and fetal movement were reviewed in detail with the patient.    Return in about 2 weeks (around 02/06/2018) for ROB and follow up anatomy scan.  Vena Austria, MD, Evern [facility], [facility] 01/23/2018, 9:50 AM

## 2018-01-23 NOTE — Progress Notes (Signed)
ROB Anatomy scan today Resend Fioricet

## 2018-02-06 ENCOUNTER — Ambulatory Visit (INDEPENDENT_AMBULATORY_CARE_PROVIDER_SITE_OTHER): Payer: Medicaid Other

## 2018-02-06 ENCOUNTER — Ambulatory Visit (INDEPENDENT_AMBULATORY_CARE_PROVIDER_SITE_OTHER): Payer: Medicaid Other | Admitting: Obstetrics and Gynecology

## 2018-02-06 VITALS — BP 120/80 | Wt 322.0 lb

## 2018-02-06 DIAGNOSIS — Z0489 Encounter for examination and observation for other specified reasons: Secondary | ICD-10-CM | POA: Diagnosis not present

## 2018-02-06 DIAGNOSIS — IMO0002 Reserved for concepts with insufficient information to code with codable children: Secondary | ICD-10-CM

## 2018-02-06 DIAGNOSIS — O4402 Placenta previa specified as without hemorrhage, second trimester: Secondary | ICD-10-CM

## 2018-02-06 DIAGNOSIS — O99211 Obesity complicating pregnancy, first trimester: Secondary | ICD-10-CM

## 2018-02-06 DIAGNOSIS — O0992 Supervision of high risk pregnancy, unspecified, second trimester: Secondary | ICD-10-CM

## 2018-02-06 DIAGNOSIS — O0990 Supervision of high risk pregnancy, unspecified, unspecified trimester: Secondary | ICD-10-CM

## 2018-02-06 DIAGNOSIS — Z3A21 21 weeks gestation of pregnancy: Secondary | ICD-10-CM

## 2018-02-06 NOTE — Progress Notes (Signed)
Routine Prenatal Care Visit  Subjective  Patricia Vazquez is a 32 y.o. G3P2002 at 7073w0d being seen today for ongoing prenatal care.  She is currently monitored for the following issues for this high-risk pregnancy and has Supervision of high risk pregnancy, antepartum; BMI 45.0-49.9, adult (HCC); Obesity affecting pregnancy; and Placenta previa on their problem list.  ----------------------------------------------------------------------------------- Patient reports no complaints.   Contractions: Not present. Vag. Bleeding: None.  Movement: Present. Denies leaking of fluid.  ----------------------------------------------------------------------------------- The following portions of the patient's history were reviewed and updated as appropriate: allergies, current medications, past family history, past medical history, past social history, past surgical history and problem list. Problem list updated.   Objective  Blood pressure 120/80, weight (!) 322 lb (146.1 kg), last menstrual period 08/21/2017. Pregravid weight 320 lb (145.2 kg) Total Weight Gain 2 lb (0.907 kg)  Body mass index is 47.55 kg/m.  Urinalysis: Urine Protein: 1+ Urine Glucose: Negative  Fetal Status:     Movement: Present     General:  Alert, oriented and cooperative. Patient is in no acute distress.  Skin: Skin is warm and dry. No rash noted.   Cardiovascular: Normal heart rate noted  Respiratory: Normal respiratory effort, no problems with respiration noted  Abdomen: Soft, gravid, appropriate for gestational age. Pain/Pressure: Absent     Pelvic:  Cervical exam deferred        Extremities: Normal range of motion.     ental Status: Normal mood and affect. Normal behavior. Normal judgment and thought content.   [name] Ob Comp + 14 Wk  Result Date: 01/23/2018 ULTRASOUND REPORT Location: [facility] Date of Service: 01/23/2018 Patient Name: Patricia Vazquez DOB: Jul 21, 1986 MRN: [id number] Indications:Anatomy U/S  Findings: Mason JimSingleton intrauterine pregnancy is visualized with FHR at 139 BPM. Biometrics give an (U/S) Gestational age of 3071w2d and an (U/S) EDD of 06/10/2018; this correlates with the clinically established Estimated Date of Delivery: 06/15/18 Fetal presentation is Breech. EFW: 373 g (13 oz). Placenta: Anterior Placenta previa is seen. AFI: Subjectively normal. Anatomic survey is incomplete for cardiac views, PCI, ACI, diaphragm, face,gender and suboptimal for spine, and brain viws and normal; Gender - Unable to identified due to fetus position.  - Technically very difficult patient. Right Ovary is normal in appearance. Left Ovary is normal appearance. Survey of the adnexa demonstrates no adnexal masses. There is no free peritoneal fluid in the cul de sac. Impression: 1. 6760w4d Viable Singleton Intrauterine pregnancy by U/S. 2. (U/S) EDD is consistent with Clinically established Estimated Date of Delivery: 06/15/18 . 3. Normal Anatomy Scan Recommendations: 1.Clinical correlation with the patient's History and Physical Exam. 2. Follow up ultrasound for remaining anatomy and placenta. Mital bahen P Patel, RDMS  There is a singleton gestation with subjectively normal amniotic fluid volume. The fetal biometry correlates with established dating. Detailed evaluation of the fetal anatomy was performed.The fetal anatomical survey appears within normal limits within the resolution of ultrasound as described above but remains incomplete for the above mentioned vies.  It must be noted that a normal ultrasound is unable to rule out fetal aneuploidy.  Vena AustriaAndreas Abbegail Matuska, MD, Merlinda FrederickFACOG [facility], [facility] 01/23/2018, 9:31 AM   [name] Ob Follow Up  Result Date: 02/07/2018 ULTRASOUND REPORT Patient Name: Patricia Vazquez DOB: Jul 21, 1986 MRN: [id number] Location: [facility] Date of Service: 02/06/2018 Indications:F/U Anatomy Findings: Mason JimSingleton intrauterine pregnancy is visualized with FHR at 147 BPM. Fetal  presentation is Breech. Placenta: Anterior, grade 1, low-lying (4mm from cx  os) AFI: subjectively normal. Anatomic survey is incomplete for RVOT and nose/lipd due to technically difficult study and fetal position . Impression: 1. [redacted]w[redacted]d Viable Singleton Intrauterine pregnancy previously established criteria. 2. Normal Anatomy Scan is still incomplete for incomplete for RVOT and nose/lipd due to technically difficult study and fetal position Recommendations: 1.Clinical correlation with the patient's History and Physical Exam. Willette Alma, RDMS, RVT There is a singleton gestation with subjectively normal amniotic fluid volume.  Limited evaluation of the fetal anatomy was performed today, focusing on on anatomic structures not fully visualized at the time of prior study.The visualized fetal anatomical survey appears within normal limits within the resolution of ultrasound as described above, and the anatomic survey is remains incomplete for RVOT and nose lips, in addition the placenta remains low lying.  It must be noted that a normal ultrasound is unable to rule out fetal aneuploidy.  Vena Austria, MD, Evern [facility], [facility] 02/07/2018, 9:53 AM     Assessment   32 y.o. G3P2002 at [redacted]w[redacted]d by  06/15/2018, by Ultrasound presenting for routine prenatal visit  Plan   THIRD Problems (from 10/12/17 to present)    Problem Noted Resolved   Placenta previa 01/23/2018 by Vena Austria, MD No   Supervision of high risk pregnancy, antepartum 10/12/2017 by Oswaldo Conroy, CNM No   Overview Addendum 01/23/2018  9:35 AM by Vena Austria, MD    Clinic [facility] Prenatal Labs  Dating 7 week [place] Blood type: O/Positive/-- (02/08 1157)   Genetic Screen Declines Antibody:Negative (02/08 1157)  Anatomic [place] Incomplete Rubella: 2.60 (02/08 1157) Varicella: Immune  GTT Early: 116              Third trimester:  RPR: Non Reactive (02/08 1157)   Rhogam  HBsAg: Negative (02/08 1157)   TDaP  vaccine                       Flu Shot: HIV: Non Reactive (02/08 1157)   Baby Food                                GBS:   Contraception  Pap: 08/19/17, NILM/HPV Negative  CBB     CS/VBAC    Support Person              Obesity affecting pregnancy 10/12/2017 by Oswaldo Conroy, CNM No       Gestational age appropriate obstetric precautions including but not limited to vaginal bleeding, contractions, leaking of fluid and fetal movement were reviewed in detail with the patient.   -Anatomy still incomplete, placenta low lying 4mm  Return in about 4 years (around 02/06/2022) for 3-4 week follow up anatomy scan.  Vena Austria, MD, [facility], [facility] 02/09/2018, 10:59 AM

## 2018-03-06 ENCOUNTER — Encounter: Payer: Medicaid Other | Admitting: Certified Nurse Midwife

## 2018-03-06 ENCOUNTER — Other Ambulatory Visit: Payer: Medicaid Other

## 2018-03-10 ENCOUNTER — Encounter: Payer: Medicaid Other | Admitting: Advanced Practice Midwife

## 2018-03-10 ENCOUNTER — Other Ambulatory Visit: Payer: Medicaid Other

## 2018-03-16 ENCOUNTER — Ambulatory Visit (INDEPENDENT_AMBULATORY_CARE_PROVIDER_SITE_OTHER): Payer: Medicaid Other | Admitting: Maternal Newborn

## 2018-03-16 ENCOUNTER — Ambulatory Visit (INDEPENDENT_AMBULATORY_CARE_PROVIDER_SITE_OTHER): Payer: Medicaid Other

## 2018-03-16 ENCOUNTER — Encounter: Payer: Self-pay | Admitting: Maternal Newborn

## 2018-03-16 VITALS — BP 114/70 | Wt 324.0 lb

## 2018-03-16 DIAGNOSIS — O4402 Placenta previa specified as without hemorrhage, second trimester: Secondary | ICD-10-CM | POA: Diagnosis not present

## 2018-03-16 DIAGNOSIS — Z3A27 27 weeks gestation of pregnancy: Secondary | ICD-10-CM

## 2018-03-16 DIAGNOSIS — Z6841 Body Mass Index (BMI) 40.0 and over, adult: Secondary | ICD-10-CM

## 2018-03-16 DIAGNOSIS — Z369 Encounter for antenatal screening, unspecified: Secondary | ICD-10-CM

## 2018-03-16 DIAGNOSIS — O99212 Obesity complicating pregnancy, second trimester: Secondary | ICD-10-CM

## 2018-03-16 DIAGNOSIS — O0990 Supervision of high risk pregnancy, unspecified, unspecified trimester: Secondary | ICD-10-CM

## 2018-03-16 NOTE — Progress Notes (Signed)
C/O numbness right hand.rj

## 2018-03-16 NOTE — Patient Instructions (Signed)
Third Trimester of Pregnancy The third trimester is from week 28 through week 40 (months 7 through 9). The third trimester is a time when the unborn baby (fetus) is growing rapidly. At the end of the ninth month, the fetus is about 20 inches in length and weighs 6-10 pounds. Body changes during your third trimester Your body will continue to go through many changes during pregnancy. The changes vary from woman to woman. During the third trimester:  Your weight will continue to increase. You can expect to gain 25-35 pounds (11-16 kg) by the end of the pregnancy.  You may begin to get stretch marks on your hips, abdomen, and breasts.  You may urinate more often because the fetus is moving lower into your pelvis and pressing on your bladder.  You may develop or continue to have heartburn. This is caused by increased hormones that slow down muscles in the digestive tract.  You may develop or continue to have constipation because increased hormones slow digestion and cause the muscles that push waste through your intestines to relax.  You may develop hemorrhoids. These are swollen veins (varicose veins) in the rectum that can itch or be painful.  You may develop swollen, bulging veins (varicose veins) in your legs.  You may have increased body aches in the pelvis, back, or thighs. This is due to weight gain and increased hormones that are relaxing your joints.  You may have changes in your hair. These can include thickening of your hair, rapid growth, and changes in texture. Some women also have hair loss during or after pregnancy, or hair that feels dry or thin. Your hair will most likely return to normal after your baby is born.  Your breasts will continue to grow and they will continue to become tender. A yellow fluid (colostrum) may leak from your breasts. This is the first milk you are producing for your baby.  Your belly button may stick out.  You may notice more swelling in your hands,  face, or ankles.  You may have increased tingling or numbness in your hands, arms, and legs. The skin on your belly may also feel numb.  You may feel short of breath because of your expanding uterus.  You may have more problems sleeping. This can be caused by the size of your belly, increased need to urinate, and an increase in your body's metabolism.  You may notice the fetus "dropping," or moving lower in your abdomen (lightening).  You may have increased vaginal discharge.  You may notice your joints feel loose and you may have pain around your pelvic bone.  What to expect at prenatal visits You will have prenatal exams every 2 weeks until week 36. Then you will have weekly prenatal exams. During a routine prenatal visit:  You will be weighed to make sure you and the baby are growing normally.  Your blood pressure will be taken.  Your abdomen will be measured to track your baby's growth.  The fetal heartbeat will be listened to.  Any test results from the previous visit will be discussed.  You may have a cervical check near your due date to see if your cervix has softened or thinned (effaced).  You will be tested for Group B streptococcus. This happens between 35 and 37 weeks.  Your health care provider may ask you:  What your birth plan is.  How you are feeling.  If you are feeling the baby move.  If you have had   any abnormal symptoms, such as leaking fluid, bleeding, severe headaches, or abdominal cramping.  If you are using any tobacco products, including cigarettes, chewing tobacco, and electronic cigarettes.  If you have any questions.  Other tests or screenings that may be performed during your third trimester include:  Blood tests that check for low iron levels (anemia).  Fetal testing to check the health, activity level, and growth of the fetus. Testing is done if you have certain medical conditions or if there are problems during the  pregnancy.  Nonstress test (NST). This test checks the health of your baby to make sure there are no signs of problems, such as the baby not getting enough oxygen. During this test, a belt is placed around your belly. The baby is made to move, and its heart rate is monitored during movement.  What is false labor? False labor is a condition in which you feel small, irregular tightenings of the muscles in the womb (contractions) that usually go away with rest, changing position, or drinking water. These are called Braxton Hicks contractions. Contractions may last for hours, days, or even weeks before true labor sets in. If contractions come at regular intervals, become more frequent, increase in intensity, or become painful, you should see your health care provider. What are the signs of labor?  Abdominal cramps.  Regular contractions that start at 10 minutes apart and become stronger and more frequent with time.  Contractions that start on the top of the uterus and spread down to the lower abdomen and back.  Increased pelvic pressure and dull back pain.  A watery or bloody mucus discharge that comes from the vagina.  Leaking of amniotic fluid. This is also known as your "water breaking." It could be a slow trickle or a gush. Let your health care provider know if it has a color or strange odor. If you have any of these signs, call your health care provider right away, even if it is before your due date. Follow these instructions at home: Medicines  Follow your health care provider's instructions regarding medicine use. Specific medicines may be either safe or unsafe to take during pregnancy.  Take a prenatal vitamin that contains at least 600 micrograms (mcg) of folic acid.  If you develop constipation, try taking a stool softener if your health care provider approves. Eating and drinking  Eat a balanced diet that includes fresh fruits and vegetables, whole grains, good sources of protein  such as meat, eggs, or tofu, and low-fat dairy. Your health care provider will help you determine the amount of weight gain that is right for you.  Avoid raw meat and uncooked cheese. These carry germs that can cause birth defects in the baby.  If you have low calcium intake from food, talk to your health care provider about whether you should take a daily calcium supplement.  Eat four or five small meals rather than three large meals a day.  Limit foods that are high in fat and processed sugars, such as fried and sweet foods.  To prevent constipation: ? Drink enough fluid to keep your urine clear or pale yellow. ? Eat foods that are high in fiber, such as fresh fruits and vegetables, whole grains, and beans. Activity  Exercise only as directed by your health care provider. Most women can continue their usual exercise routine during pregnancy. Try to exercise for 30 minutes at least 5 days a week. Stop exercising if you experience uterine contractions.  Avoid heavy   lifting.  Do not exercise in extreme heat or humidity, or at high altitudes.  Wear low-heel, comfortable shoes.  Practice good posture.  You may continue to have sex unless your health care provider tells you otherwise. Relieving pain and discomfort  Take frequent breaks and rest with your legs elevated if you have leg cramps or low back pain.  Take warm sitz baths to soothe any pain or discomfort caused by hemorrhoids. Use hemorrhoid cream if your health care provider approves.  Wear a good support bra to prevent discomfort from breast tenderness.  If you develop varicose veins: ? Wear support pantyhose or compression stockings as told by your healthcare provider. ? Elevate your feet for 15 minutes, 3-4 times a day. Prenatal care  Write down your questions. Take them to your prenatal visits.  Keep all your prenatal visits as told by your health care provider. This is important. Safety  Wear your seat belt at  all times when driving.  Make a list of emergency phone numbers, including numbers for family, friends, the hospital, and police and fire departments. General instructions  Avoid cat litter boxes and soil used by cats. These carry germs that can cause birth defects in the baby. If you have a cat, ask someone to clean the litter box for you.  Do not travel far distances unless it is absolutely necessary and only with the approval of your health care provider.  Do not use hot tubs, steam rooms, or saunas.  Do not drink alcohol.  Do not use any products that contain nicotine or tobacco, such as cigarettes and e-cigarettes. If you need help quitting, ask your health care provider.  Do not use any medicinal herbs or unprescribed drugs. These chemicals affect the formation and growth of the baby.  Do not douche or use tampons or scented sanitary pads.  Do not cross your legs for long periods of time.  To prepare for the arrival of your baby: ? Take prenatal classes to understand, practice, and ask questions about labor and delivery. ? Make a trial run to the hospital. ? Visit the hospital and tour the maternity area. ? Arrange for maternity or paternity leave through employers. ? Arrange for family and friends to take care of pets while you are in the hospital. ? Purchase a rear-facing car seat and make sure you know how to install it in your car. ? Pack your hospital bag. ? Prepare the baby's nursery. Make sure to remove all pillows and stuffed animals from the baby's crib to prevent suffocation.  Visit your dentist if you have not gone during your pregnancy. Use a soft toothbrush to brush your teeth and be gentle when you floss. Contact a health care provider if:  You are unsure if you are in labor or if your water has broken.  You become dizzy.  You have mild pelvic cramps, pelvic pressure, or nagging pain in your abdominal area.  You have lower back pain.  You have persistent  nausea, vomiting, or diarrhea.  You have an unusual or bad smelling vaginal discharge.  You have pain when you urinate. Get help right away if:  Your water breaks before 37 weeks.  You have regular contractions less than 5 minutes apart before 37 weeks.  You have a fever.  You are leaking fluid from your vagina.  You have spotting or bleeding from your vagina.  You have severe abdominal pain or cramping.  You have rapid weight loss or weight gain.    You have shortness of breath with chest pain.  You notice sudden or extreme swelling of your face, hands, ankles, feet, or legs.  Your baby makes fewer than 10 movements in 2 hours.  You have severe headaches that do not go away when you take medicine.  You have vision changes. Summary  The third trimester is from week 28 through week 40, months 7 through 9. The third trimester is a time when the unborn baby (fetus) is growing rapidly.  During the third trimester, your discomfort may increase as you and your baby continue to gain weight. You may have abdominal, leg, and back pain, sleeping problems, and an increased need to urinate.  During the third trimester your breasts will keep growing and they will continue to become tender. A yellow fluid (colostrum) may leak from your breasts. This is the first milk you are producing for your baby.  False labor is a condition in which you feel small, irregular tightenings of the muscles in the womb (contractions) that eventually go away. These are called Braxton Hicks contractions. Contractions may last for hours, days, or even weeks before true labor sets in.  Signs of labor can include: abdominal cramps; regular contractions that start at 10 minutes apart and become stronger and more frequent with time; watery or bloody mucus discharge that comes from the vagina; increased pelvic pressure and dull back pain; and leaking of amniotic fluid. This information is not intended to replace advice  given to you by your health care provider. Make sure you discuss any questions you have with your health care provider. Document Released: 08/17/2001 Document Revised: 01/29/2016 Document Reviewed: 10/24/2012 Elsevier Interactive Patient Education  2017 [employer]  

## 2018-03-16 NOTE — Progress Notes (Signed)
Routine Prenatal Care Visit  Subjective  Patricia Vazquez is a 32 y.o. G3P2002 at [redacted]w[redacted]d being seen today for ongoing prenatal care.  She is currently monitored for the following issues for this high-risk pregnancy and has Supervision of high risk pregnancy, antepartum; BMI 45.0-49.9, adult (HCC); Obesity affecting pregnancy; and Placenta previa on their problem list.  ----------------------------------------------------------------------------------- Patient reports carpal tunnel symptoms. Numbness/tingling in her right hand, wakes her up from sleep. Contractions: Not present. Vag. Bleeding: None.  Movement: Present. No leaking of fluid.  ----------------------------------------------------------------------------------- The following portions of the patient's history were reviewed and updated as appropriate: allergies, current medications, past family history, past medical history, past social history, past surgical history and problem list. Problem list updated.  Objective  Blood pressure 114/70, weight (!) 324 lb (147 kg), last menstrual period 08/21/2017. Pregravid weight 320 lb (145.2 kg) Total Weight Gain 4 lb (1.814 kg) Body mass index is 47.85 kg/m. Urinalysis: Urine Protein: Trace Urine Glucose: Negative  Fetal Status: Fetal Heart Rate (bpm): 153   Movement: Present     General:  Alert, oriented and cooperative. Patient is in no acute distress.  Skin: Skin is warm and dry. No rash noted.   Cardiovascular: Normal heart rate noted  Respiratory: Normal respiratory effort, no problems with respiration noted  Abdomen: Soft, gravid, appropriate for gestational age. Pain/Pressure: Absent     Pelvic:  Cervical exam deferred        Extremities: Normal range of motion.     Mental Status: Normal mood and affect. Normal behavior. Normal judgment and thought content.     Assessment   31 y.o. G3P2002 at [redacted]w[redacted]d, EDD 06/15/2018 by Ultrasound presenting for routine prenatal  visit.  Plan   THIRD Problems (from 10/12/17 to present)    Problem Noted Resolved   Placenta previa 01/23/2018 by Vena Austria, MD No   Supervision of high risk pregnancy, antepartum 10/12/2017 by Oswaldo Conroy, CNM No   Overview Addendum 01/23/2018  9:35 AM by Vena Austria, MD    Clinic [facility] Prenatal Labs  Dating 7 week [place] Blood type: O/Positive/-- (02/08 1157)   Genetic Screen Declines Antibody:Negative (02/08 1157)  Anatomic [place] Incomplete Rubella: 2.60 (02/08 1157) Varicella: Immune  GTT Early: 116              Third trimester:  RPR: Non Reactive (02/08 1157)   Rhogam  HBsAg: Negative (02/08 1157)   TDaP vaccine                       Flu Shot: HIV: Non Reactive (02/08 1157)   Baby Food                                GBS:   Contraception  Pap: 08/19/17, NILM/HPV Negative  CBB     CS/VBAC    Support Person              Obesity affecting pregnancy 10/12/2017 by Oswaldo Conroy, CNM No    Anatomy scan complete/normal today. Recheck placentation in 4 weeks.   Advised wrist brace during sleep to help with carpal tunnel symptoms.  Desires BTL, will need to sign 30 day papers.  Gestational age appropriate obstetric precautions were reviewed.  Please refer to After Visit Summary for other counseling recommendations.   Return in about 1 week (around 03/23/2018) for ROB and 28 week labs/GTT.  [name], CNM  03/16/2018  11:54 AM

## 2018-03-16 NOTE — Addendum Note (Signed)
Addended by: Marcelyn BruinsSCHMID, Lachlan Pelto on: 03/16/2018 11:58 AM   Modules accepted: Orders

## 2018-03-23 ENCOUNTER — Other Ambulatory Visit: Payer: Medicaid Other

## 2018-03-23 ENCOUNTER — Encounter: Payer: Medicaid Other | Admitting: Obstetrics and Gynecology

## 2018-03-24 ENCOUNTER — Other Ambulatory Visit: Payer: Medicaid Other

## 2018-03-24 ENCOUNTER — Ambulatory Visit (INDEPENDENT_AMBULATORY_CARE_PROVIDER_SITE_OTHER): Payer: Medicaid Other | Admitting: Obstetrics and Gynecology

## 2018-03-24 VITALS — BP 110/64 | Wt 318.0 lb

## 2018-03-24 DIAGNOSIS — O0990 Supervision of high risk pregnancy, unspecified, unspecified trimester: Secondary | ICD-10-CM

## 2018-03-24 DIAGNOSIS — O99213 Obesity complicating pregnancy, third trimester: Secondary | ICD-10-CM

## 2018-03-24 DIAGNOSIS — O4403 Placenta previa specified as without hemorrhage, third trimester: Secondary | ICD-10-CM

## 2018-03-24 DIAGNOSIS — Z6841 Body Mass Index (BMI) 40.0 and over, adult: Secondary | ICD-10-CM

## 2018-03-24 DIAGNOSIS — Z3A28 28 weeks gestation of pregnancy: Secondary | ICD-10-CM

## 2018-03-24 NOTE — Progress Notes (Signed)
ROB 1 Hour GTT No concerns

## 2018-03-24 NOTE — Progress Notes (Signed)
Routine Prenatal Care Visit  Subjective  Patricia Vazquez is a 32 y.o. G3P2002 at [redacted]w[redacted]d being seen today for ongoing prenatal care.  She is currently monitored for the following issues for this high-risk pregnancy and has Supervision of high risk pregnancy, antepartum; BMI 45.0-49.9, adult (HCC); Obesity affecting pregnancy; and Placenta previa on their problem list.  ----------------------------------------------------------------------------------- Patient reports no complaints.   Contractions: Not present. Vag. Bleeding: None.  Movement: Present. Denies leaking of fluid.  ----------------------------------------------------------------------------------- The following portions of the patient's history were reviewed and updated as appropriate: allergies, current medications, past family history, past medical history, past social history, past surgical history and problem list. Problem list updated.   Objective  Blood pressure 110/64, weight (!) 318 lb (144.2 kg), last menstrual period 08/21/2017. Pregravid weight 320 lb (145.2 kg) Total Weight Gain -2 lb (-0.907 kg) Urinalysis: Urine Protein: Trace Urine Glucose: Negative  Fetal Status: Fetal Heart Rate (bpm): 141   Movement: Present     General:  Alert, oriented and cooperative. Patient is in no acute distress.  Skin: Skin is warm and dry. No rash noted.   Cardiovascular: Normal heart rate noted  Respiratory: Normal respiratory effort, no problems with respiration noted  Abdomen: Soft, gravid, appropriate for gestational age. Pain/Pressure: Absent     Pelvic:  Cervical exam deferred        Extremities: Normal range of motion.     ental Status: Normal mood and affect. Normal behavior. Normal judgment and thought content.     Assessment   32 y.o. G3P2002 at [redacted]w[redacted]d by  06/15/2018, by Ultrasound presenting for routine prenatal visit  Plan   THIRD Problems (from 10/12/17 to present)    Problem Noted Resolved   Placenta  previa 01/23/2018 by Vena Austria, MD No   Supervision of high risk pregnancy, antepartum 10/12/2017 by Oswaldo Conroy, CNM No   Overview Addendum 03/24/2018  3:30 PM by Natale Milch, MD    Clinic [facility] Prenatal Labs  Dating 7 week [place] Blood type: O/Positive/-- (02/08 1157)   Genetic Screen Declines Antibody:Negative (02/08 1157)  Anatomic [place] Complete 03/16/18 Rubella: 2.60 (02/08 1157) Varicella: Immune  GTT Early: 116               Third trimester:  RPR: Non Reactive (02/08 1157)   Rhogam N/A HBsAg: Negative (02/08 1157)   TDaP vaccine                       Flu Shot: HIV: Non Reactive (02/08 1157)   Baby Food  bottle, considering breast, problems before with supply  GBS:   Contraception Desires BTL Pap: 08/19/17, NILM/HPV Negative  CBB     CS/VBAC Not applicable   Support Person              BMI 45.0-49.9, adult (HCC) 10/12/2017 by Oswaldo Conroy, CNM No   Overview Signed 03/16/2018 11:30 AM by Oswaldo Conroy, CNM    BMI >=40 [ ]  early 1h gtt -  [ ]  u/s for dating [ ]   [ ]  nutritional goals [ ]  folic acid 1mg  [ ]  bASA (>12 weeks) [ ]  consider nutrition consult [ ]  consider maternal EKG 1st trimester [ ]  Growth u/s 28 [ ] , 32 [ ] , 36 weeks [ ]  [ ]  NST/AFI weekly 36+ weeks (36[] , 37[] , 38[] , 39[] , 40[] ) [ ]  IOL by 41 weeks (scheduled, prn [] )      Obesity affecting pregnancy 10/12/2017 by  Oswaldo ConroySchmid, Jacelyn Y, CNM No   Overview Signed 03/24/2018  3:31 PM by Natale MilchSchuman, Christanna R, MD    BMI >=40 [ ]  early 1h gtt -  [ ]  u/s for dating [ ]   [ ]  nutritional goals [ ]  folic acid 1mg  [ ]  bASA (>12 weeks) [ ]  consider nutrition consult [ ]  consider maternal EKG 1st trimester [ ]  Growth u/s 28 [ ] , 32 [ ] , 36 weeks [ ]  [ ]  NST/AFI weekly 36+ weeks (36[] , 37[] , 38[] , 39[] , 40[] ) [ ]  IOL by 41 weeks (scheduled, prn [] )           Gestational age appropriate obstetric precautions including but not limited to vaginal bleeding, contractions, leaking of fluid and  fetal movement were reviewed in detail with the patient.    Tubal not signed today, she needs to verify how her name is on her insurance card.  Needs to have a growth US every 4 weeks.  Needs antenatal testing starting at 36 weeks.  Has anesthesia follow up on 04/10/18 Return in about 2 years (around 03/24/2020) for US and ROB.  Adelene Idlerhristanna Schuman MD [facility] 03/24/18 3:33 PM

## 2018-03-25 LAB — 28 WEEK RH+PANEL
BASOS: 0 %
Basophils Absolute: 0 10*3/uL (ref 0.0–0.2)
EOS (ABSOLUTE): 0.1 10*3/uL (ref 0.0–0.4)
Eos: 1 %
GESTATIONAL DIABETES SCREEN: 150 mg/dL — AB (ref 65–139)
HEMOGLOBIN: 13.3 g/dL (ref 11.1–15.9)
HIV Screen 4th Generation wRfx: NONREACTIVE
Hematocrit: 38 % (ref 34.0–46.6)
IMMATURE GRANULOCYTES: 1 %
Immature Grans (Abs): 0.1 10*3/uL (ref 0.0–0.1)
Lymphocytes Absolute: 2.4 10*3/uL (ref 0.7–3.1)
Lymphs: 24 %
MCH: 31.6 pg (ref 26.6–33.0)
MCHC: 35 g/dL (ref 31.5–35.7)
MCV: 90 fL (ref 79–97)
MONOS ABS: 0.6 10*3/uL (ref 0.1–0.9)
Monocytes: 6 %
NEUTROS ABS: 7 10*3/uL (ref 1.4–7.0)
Neutrophils: 68 %
Platelets: 237 10*3/uL (ref 150–450)
RBC: 4.21 x10E6/uL (ref 3.77–5.28)
RDW: 12.8 % (ref 12.3–15.4)
RPR: NONREACTIVE
WBC: 10.3 10*3/uL (ref 3.4–10.8)

## 2018-03-27 ENCOUNTER — Other Ambulatory Visit: Payer: Self-pay | Admitting: Maternal Newborn

## 2018-03-27 DIAGNOSIS — O99810 Abnormal glucose complicating pregnancy: Secondary | ICD-10-CM

## 2018-03-27 NOTE — Progress Notes (Signed)
3 hour glucose ordered and left message with patient to schedule.

## 2018-04-03 ENCOUNTER — Telehealth: Payer: Self-pay | Admitting: Maternal Newborn

## 2018-04-03 NOTE — Telephone Encounter (Signed)
Called and left voice mail for patient to call back to be schedule °

## 2018-04-03 NOTE — Telephone Encounter (Signed)
-----   Message from Oswaldo ConroyJacelyn Y Schmid, CNM sent at 03/31/2018  4:55 PM EDT ----- I have tried to contact this patient about scheduling a 3 hour glucose test without success. She is seeing me on 8/2. Would you please try to get in touch with her on Monday to see if she can do a 3 hour GTT when she comes in, or on another day if that works better for her? Thanks.

## 2018-04-04 NOTE — Telephone Encounter (Signed)
Patient is schedule for 3 gtt

## 2018-04-07 ENCOUNTER — Encounter: Payer: Self-pay | Admitting: Maternal Newborn

## 2018-04-07 ENCOUNTER — Other Ambulatory Visit: Payer: Medicaid Other

## 2018-04-07 ENCOUNTER — Ambulatory Visit (INDEPENDENT_AMBULATORY_CARE_PROVIDER_SITE_OTHER): Payer: Medicaid Other | Admitting: Maternal Newborn

## 2018-04-07 ENCOUNTER — Encounter: Payer: Medicaid Other | Admitting: Maternal Newborn

## 2018-04-07 ENCOUNTER — Ambulatory Visit (INDEPENDENT_AMBULATORY_CARE_PROVIDER_SITE_OTHER): Payer: Medicaid Other

## 2018-04-07 VITALS — BP 120/80 | Wt 315.0 lb

## 2018-04-07 DIAGNOSIS — O0990 Supervision of high risk pregnancy, unspecified, unspecified trimester: Secondary | ICD-10-CM

## 2018-04-07 DIAGNOSIS — O99810 Abnormal glucose complicating pregnancy: Secondary | ICD-10-CM

## 2018-04-07 DIAGNOSIS — O99213 Obesity complicating pregnancy, third trimester: Secondary | ICD-10-CM | POA: Diagnosis not present

## 2018-04-07 DIAGNOSIS — O0993 Supervision of high risk pregnancy, unspecified, third trimester: Secondary | ICD-10-CM

## 2018-04-07 DIAGNOSIS — Z6841 Body Mass Index (BMI) 40.0 and over, adult: Secondary | ICD-10-CM | POA: Diagnosis not present

## 2018-04-07 DIAGNOSIS — Z3A30 30 weeks gestation of pregnancy: Secondary | ICD-10-CM

## 2018-04-07 NOTE — Patient Instructions (Signed)
Third Trimester of Pregnancy The third trimester is from week 28 through week 40 (months 7 through 9). The third trimester is a time when the unborn baby (fetus) is growing rapidly. At the end of the ninth month, the fetus is about 20 inches in length and weighs 6-10 pounds. Body changes during your third trimester Your body will continue to go through many changes during pregnancy. The changes vary from woman to woman. During the third trimester:  Your weight will continue to increase. You can expect to gain 25-35 pounds (11-16 kg) by the end of the pregnancy.  You may begin to get stretch marks on your hips, abdomen, and breasts.  You may urinate more often because the fetus is moving lower into your pelvis and pressing on your bladder.  You may develop or continue to have heartburn. This is caused by increased hormones that slow down muscles in the digestive tract.  You may develop or continue to have constipation because increased hormones slow digestion and cause the muscles that push waste through your intestines to relax.  You may develop hemorrhoids. These are swollen veins (varicose veins) in the rectum that can itch or be painful.  You may develop swollen, bulging veins (varicose veins) in your legs.  You may have increased body aches in the pelvis, back, or thighs. This is due to weight gain and increased hormones that are relaxing your joints.  You may have changes in your hair. These can include thickening of your hair, rapid growth, and changes in texture. Some women also have hair loss during or after pregnancy, or hair that feels dry or thin. Your hair will most likely return to normal after your baby is born.  Your breasts will continue to grow and they will continue to become tender. A yellow fluid (colostrum) may leak from your breasts. This is the first milk you are producing for your baby.  Your belly button may stick out.  You may notice more swelling in your hands,  face, or ankles.  You may have increased tingling or numbness in your hands, arms, and legs. The skin on your belly may also feel numb.  You may feel short of breath because of your expanding uterus.  You may have more problems sleeping. This can be caused by the size of your belly, increased need to urinate, and an increase in your body's metabolism.  You may notice the fetus "dropping," or moving lower in your abdomen (lightening).  You may have increased vaginal discharge.  You may notice your joints feel loose and you may have pain around your pelvic bone.  What to expect at prenatal visits You will have prenatal exams every 2 weeks until week 36. Then you will have weekly prenatal exams. During a routine prenatal visit:  You will be weighed to make sure you and the baby are growing normally.  Your blood pressure will be taken.  Your abdomen will be measured to track your baby's growth.  The fetal heartbeat will be listened to.  Any test results from the previous visit will be discussed.  You may have a cervical check near your due date to see if your cervix has softened or thinned (effaced).  You will be tested for Group B streptococcus. This happens between 35 and 37 weeks.  Your health care provider may ask you:  What your birth plan is.  How you are feeling.  If you are feeling the baby move.  If you have had   any abnormal symptoms, such as leaking fluid, bleeding, severe headaches, or abdominal cramping.  If you are using any tobacco products, including cigarettes, chewing tobacco, and electronic cigarettes.  If you have any questions.  Other tests or screenings that may be performed during your third trimester include:  Blood tests that check for low iron levels (anemia).  Fetal testing to check the health, activity level, and growth of the fetus. Testing is done if you have certain medical conditions or if there are problems during the  pregnancy.  Nonstress test (NST). This test checks the health of your baby to make sure there are no signs of problems, such as the baby not getting enough oxygen. During this test, a belt is placed around your belly. The baby is made to move, and its heart rate is monitored during movement.  What is false labor? False labor is a condition in which you feel small, irregular tightenings of the muscles in the womb (contractions) that usually go away with rest, changing position, or drinking water. These are called Braxton Hicks contractions. Contractions may last for hours, days, or even weeks before true labor sets in. If contractions come at regular intervals, become more frequent, increase in intensity, or become painful, you should see your health care provider. What are the signs of labor?  Abdominal cramps.  Regular contractions that start at 10 minutes apart and become stronger and more frequent with time.  Contractions that start on the top of the uterus and spread down to the lower abdomen and back.  Increased pelvic pressure and dull back pain.  A watery or bloody mucus discharge that comes from the vagina.  Leaking of amniotic fluid. This is also known as your "water breaking." It could be a slow trickle or a gush. Let your health care provider know if it has a color or strange odor. If you have any of these signs, call your health care provider right away, even if it is before your due date. Follow these instructions at home: Medicines  Follow your health care provider's instructions regarding medicine use. Specific medicines may be either safe or unsafe to take during pregnancy.  Take a prenatal vitamin that contains at least 600 micrograms (mcg) of folic acid.  If you develop constipation, try taking a stool softener if your health care provider approves. Eating and drinking  Eat a balanced diet that includes fresh fruits and vegetables, whole grains, good sources of protein  such as meat, eggs, or tofu, and low-fat dairy. Your health care provider will help you determine the amount of weight gain that is right for you.  Avoid raw meat and uncooked cheese. These carry germs that can cause birth defects in the baby.  If you have low calcium intake from food, talk to your health care provider about whether you should take a daily calcium supplement.  Eat four or five small meals rather than three large meals a day.  Limit foods that are high in fat and processed sugars, such as fried and sweet foods.  To prevent constipation: ? Drink enough fluid to keep your urine clear or pale yellow. ? Eat foods that are high in fiber, such as fresh fruits and vegetables, whole grains, and beans. Activity  Exercise only as directed by your health care provider. Most women can continue their usual exercise routine during pregnancy. Try to exercise for 30 minutes at least 5 days a week. Stop exercising if you experience uterine contractions.  Avoid heavy   lifting.  Do not exercise in extreme heat or humidity, or at high altitudes.  Wear low-heel, comfortable shoes.  Practice good posture.  You may continue to have sex unless your health care provider tells you otherwise. Relieving pain and discomfort  Take frequent breaks and rest with your legs elevated if you have leg cramps or low back pain.  Take warm sitz baths to soothe any pain or discomfort caused by hemorrhoids. Use hemorrhoid cream if your health care provider approves.  Wear a good support bra to prevent discomfort from breast tenderness.  If you develop varicose veins: ? Wear support pantyhose or compression stockings as told by your healthcare provider. ? Elevate your feet for 15 minutes, 3-4 times a day. Prenatal care  Write down your questions. Take them to your prenatal visits.  Keep all your prenatal visits as told by your health care provider. This is important. Safety  Wear your seat belt at  all times when driving.  Make a list of emergency phone numbers, including numbers for family, friends, the hospital, and police and fire departments. General instructions  Avoid cat litter boxes and soil used by cats. These carry germs that can cause birth defects in the baby. If you have a cat, ask someone to clean the litter box for you.  Do not travel far distances unless it is absolutely necessary and only with the approval of your health care provider.  Do not use hot tubs, steam rooms, or saunas.  Do not drink alcohol.  Do not use any products that contain nicotine or tobacco, such as cigarettes and e-cigarettes. If you need help quitting, ask your health care provider.  Do not use any medicinal herbs or unprescribed drugs. These chemicals affect the formation and growth of the baby.  Do not douche or use tampons or scented sanitary pads.  Do not cross your legs for long periods of time.  To prepare for the arrival of your baby: ? Take prenatal classes to understand, practice, and ask questions about labor and delivery. ? Make a trial run to the hospital. ? Visit the hospital and tour the maternity area. ? Arrange for maternity or paternity leave through employers. ? Arrange for family and friends to take care of pets while you are in the hospital. ? Purchase a rear-facing car seat and make sure you know how to install it in your car. ? Pack your hospital bag. ? Prepare the baby's nursery. Make sure to remove all pillows and stuffed animals from the baby's crib to prevent suffocation.  Visit your dentist if you have not gone during your pregnancy. Use a soft toothbrush to brush your teeth and be gentle when you floss. Contact a health care provider if:  You are unsure if you are in labor or if your water has broken.  You become dizzy.  You have mild pelvic cramps, pelvic pressure, or nagging pain in your abdominal area.  You have lower back pain.  You have persistent  nausea, vomiting, or diarrhea.  You have an unusual or bad smelling vaginal discharge.  You have pain when you urinate. Get help right away if:  Your water breaks before 37 weeks.  You have regular contractions less than 5 minutes apart before 37 weeks.  You have a fever.  You are leaking fluid from your vagina.  You have spotting or bleeding from your vagina.  You have severe abdominal pain or cramping.  You have rapid weight loss or weight gain.    You have shortness of breath with chest pain.  You notice sudden or extreme swelling of your face, hands, ankles, feet, or legs.  Your baby makes fewer than 10 movements in 2 hours.  You have severe headaches that do not go away when you take medicine.  You have vision changes. Summary  The third trimester is from week 28 through week 40, months 7 through 9. The third trimester is a time when the unborn baby (fetus) is growing rapidly.  During the third trimester, your discomfort may increase as you and your baby continue to gain weight. You may have abdominal, leg, and back pain, sleeping problems, and an increased need to urinate.  During the third trimester your breasts will keep growing and they will continue to become tender. A yellow fluid (colostrum) may leak from your breasts. This is the first milk you are producing for your baby.  False labor is a condition in which you feel small, irregular tightenings of the muscles in the womb (contractions) that eventually go away. These are called Braxton Hicks contractions. Contractions may last for hours, days, or even weeks before true labor sets in.  Signs of labor can include: abdominal cramps; regular contractions that start at 10 minutes apart and become stronger and more frequent with time; watery or bloody mucus discharge that comes from the vagina; increased pelvic pressure and dull back pain; and leaking of amniotic fluid. This information is not intended to replace advice  given to you by your health care provider. Make sure you discuss any questions you have with your health care provider. Document Released: 08/17/2001 Document Revised: 01/29/2016 Document Reviewed: 10/24/2012 Elsevier Interactive Patient Education  2017 [employer]  

## 2018-04-07 NOTE — Progress Notes (Signed)
Routine Prenatal Care Visit  Subjective  Patricia Vazquez is a 32 y.o. G3P2002 at [redacted]w[redacted]d being seen today for ongoing prenatal care.  She is currently monitored for the following issues for this high-risk pregnancy and has Supervision of high risk pregnancy, antepartum; BMI 45.0-49.9, adult (HCC); Obesity affecting pregnancy; and Placenta previa on their problem list.  ----------------------------------------------------------------------------------- Patient reports carpal tunnel symptoms.   Contractions: Not present. Vag. Bleeding: None.  Movement: Present. No leaking of fluid.  ----------------------------------------------------------------------------------- The following portions of the patient's history were reviewed and updated as appropriate: allergies, current medications, past family history, past medical history, past social history, past surgical history and problem list. Problem list updated.   Objective  Blood pressure 120/80, weight (!) 315 lb (142.9 kg), last menstrual period 08/21/2017. Body mass index is 46.52 kg/m. Pregravid weight 320 lb (145.2 kg) Total Weight Gain -5 lb (-2.268 kg) Urinalysis: Urine Protein: 1+ Urine Glucose: Negative  Fetal Status: Fetal Heart Rate (bpm): 167   Movement: Present  Presentation: Transverse  General:  Alert, oriented and cooperative. Patient is in no acute distress.  Skin: Skin is warm and dry. No rash noted.   Cardiovascular: Normal heart rate noted  Respiratory: Normal respiratory effort, no problems with respiration noted  Abdomen: Soft, gravid, appropriate for gestational age. Pain/Pressure: Absent     Pelvic:  Cervical exam deferred        Extremities: Normal range of motion.     Mental Status: Normal mood and affect. Normal behavior. Normal judgment and thought content.     Assessment   32 y.o. [id number] at [redacted]w[redacted]d, EDD 06/15/2018 by Ultrasound presenting for routine prenatal visit.  Plan   THIRD Problems (from  10/12/17 to present)    Problem Noted Resolved   Placenta previa 01/23/2018 by Vena Austria, MD No   Supervision of high risk pregnancy, antepartum 10/12/2017 by Oswaldo Conroy, CNM No   Overview Addendum 03/24/2018  3:30 PM by Natale Milch, MD    Clinic [facility] Prenatal Labs  Dating 7 week [place] Blood type: O/Positive/-- (02/08 1157)   Genetic Screen Declines Antibody:Negative (02/08 1157)  Anatomic [place] Complete 03/16/18 Rubella: 2.60 (02/08 1157) Varicella: Immune  GTT Early: 116               Third trimester:  RPR: Non Reactive (02/08 1157)   Rhogam N/A HBsAg: Negative (02/08 1157)   TDaP vaccine                       Flu Shot: HIV: Non Reactive (02/08 1157)   Baby Food  bottle, considering breast, problems before with supply  GBS:   Contraception Desires BTL Pap: 08/19/17, NILM/HPV Negative  CBB     CS/VBAC Not applicable   Support Person              BMI 45.0-49.9, adult (HCC) 10/12/2017 by Oswaldo Conroy, CNM No   Overview Signed 03/16/2018 11:30 AM by Oswaldo Conroy, CNM    BMI >=40 [ ]  early 1h gtt -  [ ]  u/s for dating [ ]   [ ]  nutritional goals [ ]  folic acid 1mg  [ ]  bASA (>12 weeks) [ ]  consider nutrition consult [ ]  consider maternal EKG 1st trimester [ ]  Growth u/s 28 [ ] , 32 [ ] , 36 weeks [ ]  [ ]  NST/AFI weekly 36+ weeks (36[] , 37[] , 38[] , 39[] , 40[] ) [ ]  IOL by 41 weeks (scheduled, prn [] )  Obesity affecting pregnancy 10/12/2017 by Oswaldo ConroySchmid, Solimar Maiden Y, CNM No   Overview Signed 03/24/2018  3:31 PM by Natale MilchSchuman, Christanna R, MD    BMI >=40 [ ]  early 1h gtt -  [ ]  u/s for dating [ ]   [ ]  nutritional goals [ ]  folic acid 1mg  [ ]  bASA (>12 weeks) [ ]  consider nutrition consult [ ]  consider maternal EKG 1st trimester [ ]  Growth u/s 28 [ ] , 32 [ ] , 36 weeks [ ]  [ ]  NST/AFI weekly 36+ weeks (36[] , 37[] , 38[] , 39[] , 40[] ) [ ]  IOL by 41 weeks (scheduled, prn [] )        3 hour GTT today.  Ultrasound showed growth at 65th percentile, EFW 3 lb 12  oz. AFI 13.52 cm. Transverse lie. Anterior placenta.  Gestational age appropriate obstetric precautions were reviewed.  Please refer to After Visit Summary for other counseling recommendations.   Return in about 2 weeks (around 04/21/2018) for ROB.  Marcelyn BruinsJacelyn Ladavia Lindenbaum, CNM 04/07/2018  10:41 AM

## 2018-04-08 LAB — GESTATIONAL GLUCOSE TOLERANCE
GLUCOSE FASTING: 114 mg/dL — AB (ref 65–94)
Glucose, GTT - 1 Hour: 205 mg/dL — ABNORMAL HIGH (ref 65–179)
Glucose, GTT - 2 Hour: 190 mg/dL — ABNORMAL HIGH (ref 65–154)
Glucose, GTT - 3 Hour: 127 mg/dL (ref 65–139)

## 2018-04-10 ENCOUNTER — Inpatient Hospital Stay: Admission: RE | Admit: 2018-04-10 | Payer: Medicaid Other | Source: Ambulatory Visit

## 2018-04-12 ENCOUNTER — Other Ambulatory Visit: Payer: Self-pay | Admitting: Maternal Newborn

## 2018-04-12 DIAGNOSIS — O24419 Gestational diabetes mellitus in pregnancy, unspecified control: Secondary | ICD-10-CM

## 2018-04-12 MED ORDER — RELION PREMIER COMPACT SYSTEM W/DEVICE KIT: 1.0000 | PACK | Freq: Once | 0 refills | Status: AC

## 2018-04-12 MED ORDER — RELION LANCETS THIN 26G MISC: 1.0000 | Freq: Four times a day (QID) | 3 refills | Status: DC

## 2018-04-12 MED ORDER — GLUCOSE BLOOD VI STRP: ORAL_STRIP | 12 refills | Status: DC

## 2018-04-12 NOTE — Progress Notes (Signed)
Referral to diabetic education and Rx sent for supplies.

## 2018-04-20 ENCOUNTER — Encounter: Payer: Medicaid Other | Attending: Maternal Newborn | Admitting: *Deleted

## 2018-04-20 ENCOUNTER — Encounter: Payer: Self-pay | Admitting: *Deleted

## 2018-04-20 VITALS — BP 110/78 | Ht 68.0 in | Wt 318.7 lb

## 2018-04-20 DIAGNOSIS — O24410 Gestational diabetes mellitus in pregnancy, diet controlled: Secondary | ICD-10-CM

## 2018-04-20 DIAGNOSIS — O24419 Gestational diabetes mellitus in pregnancy, unspecified control: Secondary | ICD-10-CM | POA: Diagnosis not present

## 2018-04-20 DIAGNOSIS — Z713 Dietary counseling and surveillance: Secondary | ICD-10-CM | POA: Insufficient documentation

## 2018-04-20 DIAGNOSIS — Z3A00 Weeks of gestation of pregnancy not specified: Secondary | ICD-10-CM | POA: Insufficient documentation

## 2018-04-20 NOTE — Patient Instructions (Signed)
Read booklet on Gestational Diabetes Follow Gestational Meal Planning Guidelines Complete a 3 Day Food Record and bring to next appointment Check blood sugars 4 x day - before breakfast and 2 hrs after every meal and record  Bring blood sugar log to all appointments Call MD for prescription for meter strips and lancets Strips Accu-Chek Guide Lancets   Accu-Chek FastClix Purchase urine ketone strips if blood sugars not controlled and check urine ketones every am:  If + increase bedtime snack to 1 protein and 2 carbohydrate servings Walk 20-30 minutes at least 5 x week if permitted by MD  

## 2018-04-20 NOTE — Progress Notes (Signed)
Diabetes Self-Management Education  Visit Type: First/Initial  Appt. Start Time: 1325 Appt. End Time: 1455  04/20/2018  [name], identified by name and date of birth, is a 32 y.o. female with a diagnosis of Diabetes: Gestational Diabetes.   ASSESSMENT  Blood pressure 110/78, height 5\' 8"  (1.727 m), weight (!) 318 lb 11.2 oz (144.6 kg), last menstrual period 08/21/2017. Body mass index is 48.46 kg/m.  Diabetes Self-Management Education - 04/20/18 1542      Visit Information   Visit Type  First/Initial      Initial Visit   Diabetes Type  Gestational Diabetes    Are you currently following a meal plan?  Yes    What type of meal plan do you follow?  "sugar free and no carbs"    Are you taking your medications as prescribed?  Yes    Date Diagnosed  2 weeks ago      Health Coping   How would you rate your overall health?  Good      Psychosocial Assessment   Patient Belief/Attitude about Diabetes  Other (comment)   "nervous"   Self-care barriers  None    Self-management support  Doctor's office;Family    Patient Concerns  Nutrition/Meal planning;Glycemic Control;Monitoring;Weight Control;Healthy Lifestyle    Special Needs  None    Preferred Learning Style  Auditory;Hands on    Learning Readiness  Change in progress    How often do you need to have someone help you when you read instructions, pamphlets, or other written materials from your doctor or pharmacy?  1 - Never    What is the last grade level you completed in school?  12th      Pre-Education Assessment   Patient understands the diabetes disease and treatment process.  Needs Instruction    Patient understands incorporating nutritional management into lifestyle.  Needs Instruction    Patient undertands incorporating physical activity into lifestyle.  Needs Instruction    Patient understands using medications safely.  Needs Instruction    Patient understands monitoring blood glucose, interpreting and using  results  Needs Instruction    Patient understands prevention, detection, and treatment of acute complications.  Needs Instruction    Patient understands prevention, detection, and treatment of chronic complications.  Needs Instruction    Patient understands how to develop strategies to address psychosocial issues.  Needs Instruction    Patient understands how to develop strategies to promote health/change behavior.  Needs Instruction      Complications   How often do you check your blood sugar?  0 times/day (not testing)   Provided Accu-Chek Guide meter and instructed on use. BG upon return demonstration was 85 mg/dL at 0:452:45 pm - 9 hrs pp.    Have you had a dilated eye exam in the past 12 months?  No    Have you had a dental exam in the past 12 months?  No    Are you checking your feet?  Yes    How many days per week are you checking your feet?  7      Dietary Intake   Breakfast  egg and sausage or egg McMuffin    Lunch  hotdog with no bun    Snack (afternoon)  pork skins, peanut butter crackers    Dinner  cihcken, beef with potatoes, beans, peas, corn, rice, salads, green beans, broccoli, occasional bread and pasta    Beverage(s)  water, diet soda, vitamin zero water  Exercise   Exercise Type  Light (walking / raking leaves)    How many days per week to you exercise?  2    How many minutes per day do you exercise?  60    Total minutes per week of exercise  120      Patient Education   Previous Diabetes Education  No    Disease state   Definition of diabetes, type 1 and 2, and the diagnosis of diabetes    Nutrition management   Role of diet in the treatment of diabetes and the relationship between the three main macronutrients and blood glucose level;Reviewed blood glucose goals for pre and post meals and how to evaluate the patients' food intake on their blood glucose level.    Physical activity and exercise   Role of exercise on diabetes management, blood pressure control and  cardiac health.    Monitoring  Taught/evaluated SMBG meter.;Purpose and frequency of SMBG.;Taught/discussed recording of test results and interpretation of SMBG.;Ketone testing, when, how.    Chronic complications  Relationship between chronic complications and blood glucose control    Psychosocial adjustment  Identified and addressed patients feelings and concerns about diabetes    Preconception care  Pregnancy and GDM  Role of pre-pregnancy blood glucose control on the development of the fetus;Role of family planning for patients with diabetes;Reviewed with patient blood glucose goals with pregnancy      Individualized Goals (developed by patient)   Reducing Risk  Improve blood sugars Prevent diabetes complications Lose weight Lead a healthier lifestyle Become more fit     Outcomes   Expected Outcomes  Demonstrated interest in learning. Expect positive outcomes       Individualized Plan for Diabetes Self-Management Training:   Learning Objective:  Patient will have a greater understanding of diabetes self-management. Patient education plan is to attend individual and/or group sessions per assessed needs and concerns.   Plan:   Patient Instructions  Read booklet on Gestational Diabetes Follow Gestational Meal Planning Guidelines Complete a 3 Day Food Record and bring to next appointment Check blood sugars 4 x day - before breakfast and 2 hrs after every meal and record  Bring blood sugar log to all appointments Call MD for prescription for meter strips and lancets Strips Accu-Chek Guide Lancets   Accu-Chek FastClix Purchase urine ketone strips if blood sugars not controlled and check urine ketones every am:  If + increase bedtime snack to 1 protein and 2 carbohydrate servings Walk 20-30 minutes at least 5 x week if permitted by MD  Expected Outcomes:  Demonstrated interest in learning. Expect positive outcomes  Education material provided:  Gestational Booklet Gestational  Meal Planning Guidelines Simple Meal Plan Viewed Gestational Diabetes Video Meter = Accu-Chek Guide 3 Day Food Record Goals for a Healthy Pregnancy  If problems or questions, patient to contact team via:  Sharion SettlerSheila Shotwell, RN, CCM, CDE [phone]  Future DSME appointment:  May 01, 2018 with the dietitian

## 2018-04-21 ENCOUNTER — Encounter: Payer: Self-pay | Admitting: Maternal Newborn

## 2018-04-21 ENCOUNTER — Ambulatory Visit (INDEPENDENT_AMBULATORY_CARE_PROVIDER_SITE_OTHER): Payer: Medicaid Other | Admitting: Maternal Newborn

## 2018-04-21 VITALS — BP 110/80 | Wt 318.0 lb

## 2018-04-21 DIAGNOSIS — Z3A32 32 weeks gestation of pregnancy: Secondary | ICD-10-CM | POA: Diagnosis not present

## 2018-04-21 DIAGNOSIS — O24410 Gestational diabetes mellitus in pregnancy, diet controlled: Secondary | ICD-10-CM

## 2018-04-21 DIAGNOSIS — O0990 Supervision of high risk pregnancy, unspecified, unspecified trimester: Secondary | ICD-10-CM

## 2018-04-21 DIAGNOSIS — O99213 Obesity complicating pregnancy, third trimester: Secondary | ICD-10-CM

## 2018-04-21 DIAGNOSIS — Z23 Encounter for immunization: Secondary | ICD-10-CM | POA: Diagnosis not present

## 2018-04-21 DIAGNOSIS — Z369 Encounter for antenatal screening, unspecified: Secondary | ICD-10-CM

## 2018-04-21 DIAGNOSIS — Z6841 Body Mass Index (BMI) 40.0 and over, adult: Secondary | ICD-10-CM

## 2018-04-21 DIAGNOSIS — O24419 Gestational diabetes mellitus in pregnancy, unspecified control: Secondary | ICD-10-CM | POA: Insufficient documentation

## 2018-04-21 LAB — POCT URINALYSIS DIPSTICK OB: GLUCOSE, UA: NEGATIVE — AB

## 2018-04-21 MED ORDER — TETANUS-DIPHTH-ACELL PERTUSSIS 5-2.5-18.5 LF-MCG/0.5 IM SUSP
0.5000 mL | Freq: Once | INTRAMUSCULAR | Status: AC
Start: 2018-04-21 — End: 2018-04-21
  Administered 2018-04-21: 0.5 mL via INTRAMUSCULAR

## 2018-04-21 NOTE — Progress Notes (Signed)
Routine Prenatal Care Visit  Subjective  Patricia Vazquez is a 32 y.o. G3P2002 at 7735w1d being seen today for ongoing prenatal care.  She is currently monitored for the following issues for this high-risk pregnancy and has Supervision of high risk pregnancy, antepartum; BMI 45.0-49.9, adult (HCC); Obesity affecting pregnancy; Placenta previa; and Gestational diabetes mellitus (GDM) in third trimester on their problem list.  ----------------------------------------------------------------------------------- Patient reports carpal tunnel symptoms and fatigue.  Contractions: Not present. Vag. Bleeding: None.  Movement: Present. No leaking of fluid.  ----------------------------------------------------------------------------------- The following portions of the patient's history were reviewed and updated as appropriate: allergies, current medications, past family history, past medical history, past social history, past surgical history and problem list. Problem list updated.  Objective  Blood pressure 110/80, weight (!) 318 lb (144.2 kg), last menstrual period 08/21/2017. Body mass index is 48.35 kg/m. Pregravid weight 320 lb (145.2 kg) Total Weight Gain -2 lb (-0.907 kg) Urinalysis: Glucose Negative, Protein Trace  Fetal Status: Fetal Heart Rate (bpm): 152   Movement: Present     General:  Alert, oriented and cooperative. Patient is in no acute distress.  Skin: Skin is warm and dry. No rash noted.   Cardiovascular: Normal heart rate noted  Respiratory: Normal respiratory effort, no problems with respiration noted  Abdomen: Soft, gravid, appropriate for gestational age. Pain/Pressure: Absent     Pelvic:  Cervical exam deferred        Extremities: Normal range of motion.     Mental Status: Normal mood and affect. Normal behavior. Normal judgment and thought content.    Assessment   32 y.o. [name] at 2735w1d, EDD 06/15/2018 by Ultrasound presenting for routine prenatal visit.  Plan     THIRD Problems (from 10/12/17 to present)    Problem Noted Resolved   Placenta previa 01/23/2018 by Vena AustriaStaebler, Andreas, MD No   Supervision of high risk pregnancy, antepartum 10/12/2017 by Oswaldo ConroySchmid, Ashe Graybeal Y, CNM No   Overview Addendum 03/24/2018  3:30 PM by Natale MilchSchuman, Christanna R, MD    Clinic [facility] Prenatal Labs  Dating 7 week US Blood type: O/Positive/-- (02/08 1157)   Genetic Screen Declines Antibody:Negative (02/08 1157)  Anatomic US Complete 03/16/18 Rubella: 2.60 (02/08 1157) Varicella: Immune  GTT Early: 116               Third trimester: 3 hour abnormal, GDM RPR: Non Reactive (02/08 1157)   Rhogam N/A HBsAg: Negative (02/08 1157)   TDaP vaccine                       Flu Shot: HIV: Non Reactive (02/08 1157)   Baby Food  bottle, considering breast, problems before with supply  GBS:   Contraception Desires BTL Pap: 08/19/17, NILM/HPV Negative  CBB     CS/VBAC Not applicable   Support Person              BMI 45.0-49.9, adult (HCC) 10/12/2017 by Oswaldo ConroySchmid, [name], CNM No   Overview Signed 03/16/2018 11:30 AM by Oswaldo ConroySchmid, [name], CNM    BMI >=40 [ ]  early 1h gtt -  [ ]  u/s for dating [ ]   [ ]  nutritional goals [ ]  folic acid 1mg  [ ]  bASA (>12 weeks) [ ]  consider nutrition consult [ ]  consider maternal EKG 1st trimester [ ]  Growth u/s 28 [ ] , 32 [ ] , 36 weeks [ ]  [ ]  NST/AFI weekly 36+ weeks (36[] , 37[] , 38[] , 39[] , 40[] ) [ ]  IOL by 41  weeks (scheduled, prn [] )      Obesity affecting pregnancy 10/12/2017 by Oswaldo ConroySchmid, Tatsuya Okray Y, CNM No   Overview Signed 03/24/2018  3:31 PM by Natale MilchSchuman, Christanna R, MD    BMI >=40 [ ]  early 1h gtt -  [ ]  u/s for dating [ ]   [ ]  nutritional goals [ ]  folic acid 1mg  [ ]  bASA (>12 weeks) [ ]  consider nutrition consult [ ]  consider maternal EKG 1st trimester [ ]  Growth u/s 28 [ ] , 32 [ ] , 36 weeks [ ]  [ ]  NST/AFI weekly 36+ weeks (36[] , 37[] , 38[] , 39[] , 40[] ) [ ]  IOL by 41 weeks (scheduled, prn [] )        Saw diabetes educator yesterday,  she now has a glucose meter and will begin self-monitoring of blood glucose. Asked her to bring log to visits. Has appointment scheduled with dietician.  Gestational age appropriate obstetric precautions were reviewed.  Please refer to After Visit Summary for other counseling recommendations.   Return in about 2 weeks (around 05/05/2018) for ROB with ultrasound.  Patricia BruinsJacelyn Patricia Vazquez, CNM 04/21/2018  10:21 AM

## 2018-04-21 NOTE — Patient Instructions (Signed)
Third Trimester of Pregnancy The third trimester is from week 28 through week 40 (months 7 through 9). The third trimester is a time when the unborn baby (fetus) is growing rapidly. At the end of the ninth month, the fetus is about 20 inches in length and weighs 6-10 pounds. Body changes during your third trimester Your body will continue to go through many changes during pregnancy. The changes vary from woman to woman. During the third trimester:  Your weight will continue to increase. You can expect to gain 25-35 pounds (11-16 kg) by the end of the pregnancy.  You may begin to get stretch marks on your hips, abdomen, and breasts.  You may urinate more often because the fetus is moving lower into your pelvis and pressing on your bladder.  You may develop or continue to have heartburn. This is caused by increased hormones that slow down muscles in the digestive tract.  You may develop or continue to have constipation because increased hormones slow digestion and cause the muscles that push waste through your intestines to relax.  You may develop hemorrhoids. These are swollen veins (varicose veins) in the rectum that can itch or be painful.  You may develop swollen, bulging veins (varicose veins) in your legs.  You may have increased body aches in the pelvis, back, or thighs. This is due to weight gain and increased hormones that are relaxing your joints.  You may have changes in your hair. These can include thickening of your hair, rapid growth, and changes in texture. Some women also have hair loss during or after pregnancy, or hair that feels dry or thin. Your hair will most likely return to normal after your baby is born.  Your breasts will continue to grow and they will continue to become tender. A yellow fluid (colostrum) may leak from your breasts. This is the first milk you are producing for your baby.  Your belly button may stick out.  You may notice more swelling in your hands,  face, or ankles.  You may have increased tingling or numbness in your hands, arms, and legs. The skin on your belly may also feel numb.  You may feel short of breath because of your expanding uterus.  You may have more problems sleeping. This can be caused by the size of your belly, increased need to urinate, and an increase in your body's metabolism.  You may notice the fetus "dropping," or moving lower in your abdomen (lightening).  You may have increased vaginal discharge.  You may notice your joints feel loose and you may have pain around your pelvic bone.  What to expect at prenatal visits You will have prenatal exams every 2 weeks until week 36. Then you will have weekly prenatal exams. During a routine prenatal visit:  You will be weighed to make sure you and the baby are growing normally.  Your blood pressure will be taken.  Your abdomen will be measured to track your baby's growth.  The fetal heartbeat will be listened to.  Any test results from the previous visit will be discussed.  You may have a cervical check near your due date to see if your cervix has softened or thinned (effaced).  You will be tested for Group B streptococcus. This happens between 35 and 37 weeks.  Your health care provider may ask you:  What your birth plan is.  How you are feeling.  If you are feeling the baby move.  If you have had   any abnormal symptoms, such as leaking fluid, bleeding, severe headaches, or abdominal cramping.  If you are using any tobacco products, including cigarettes, chewing tobacco, and electronic cigarettes.  If you have any questions.  Other tests or screenings that may be performed during your third trimester include:  Blood tests that check for low iron levels (anemia).  Fetal testing to check the health, activity level, and growth of the fetus. Testing is done if you have certain medical conditions or if there are problems during the  pregnancy.  Nonstress test (NST). This test checks the health of your baby to make sure there are no signs of problems, such as the baby not getting enough oxygen. During this test, a belt is placed around your belly. The baby is made to move, and its heart rate is monitored during movement.  What is false labor? False labor is a condition in which you feel small, irregular tightenings of the muscles in the womb (contractions) that usually go away with rest, changing position, or drinking water. These are called Braxton Hicks contractions. Contractions may last for hours, days, or even weeks before true labor sets in. If contractions come at regular intervals, become more frequent, increase in intensity, or become painful, you should see your health care provider. What are the signs of labor?  Abdominal cramps.  Regular contractions that start at 10 minutes apart and become stronger and more frequent with time.  Contractions that start on the top of the uterus and spread down to the lower abdomen and back.  Increased pelvic pressure and dull back pain.  A watery or bloody mucus discharge that comes from the vagina.  Leaking of amniotic fluid. This is also known as your "water breaking." It could be a slow trickle or a gush. Let your health care provider know if it has a color or strange odor. If you have any of these signs, call your health care provider right away, even if it is before your due date. Follow these instructions at home: Medicines  Follow your health care provider's instructions regarding medicine use. Specific medicines may be either safe or unsafe to take during pregnancy.  Take a prenatal vitamin that contains at least 600 micrograms (mcg) of folic acid.  If you develop constipation, try taking a stool softener if your health care provider approves. Eating and drinking  Eat a balanced diet that includes fresh fruits and vegetables, whole grains, good sources of protein  such as meat, eggs, or tofu, and low-fat dairy. Your health care provider will help you determine the amount of weight gain that is right for you.  Avoid raw meat and uncooked cheese. These carry germs that can cause birth defects in the baby.  If you have low calcium intake from food, talk to your health care provider about whether you should take a daily calcium supplement.  Eat four or five small meals rather than three large meals a day.  Limit foods that are high in fat and processed sugars, such as fried and sweet foods.  To prevent constipation: ? Drink enough fluid to keep your urine clear or pale yellow. ? Eat foods that are high in fiber, such as fresh fruits and vegetables, whole grains, and beans. Activity  Exercise only as directed by your health care provider. Most women can continue their usual exercise routine during pregnancy. Try to exercise for 30 minutes at least 5 days a week. Stop exercising if you experience uterine contractions.  Avoid heavy   lifting.  Do not exercise in extreme heat or humidity, or at high altitudes.  Wear low-heel, comfortable shoes.  Practice good posture.  You may continue to have sex unless your health care provider tells you otherwise. Relieving pain and discomfort  Take frequent breaks and rest with your legs elevated if you have leg cramps or low back pain.  Take warm sitz baths to soothe any pain or discomfort caused by hemorrhoids. Use hemorrhoid cream if your health care provider approves.  Wear a good support bra to prevent discomfort from breast tenderness.  If you develop varicose veins: ? Wear support pantyhose or compression stockings as told by your healthcare provider. ? Elevate your feet for 15 minutes, 3-4 times a day. Prenatal care  Write down your questions. Take them to your prenatal visits.  Keep all your prenatal visits as told by your health care provider. This is important. Safety  Wear your seat belt at  all times when driving.  Make a list of emergency phone numbers, including numbers for family, friends, the hospital, and police and fire departments. General instructions  Avoid cat litter boxes and soil used by cats. These carry germs that can cause birth defects in the baby. If you have a cat, ask someone to clean the litter box for you.  Do not travel far distances unless it is absolutely necessary and only with the approval of your health care provider.  Do not use hot tubs, steam rooms, or saunas.  Do not drink alcohol.  Do not use any products that contain nicotine or tobacco, such as cigarettes and e-cigarettes. If you need help quitting, ask your health care provider.  Do not use any medicinal herbs or unprescribed drugs. These chemicals affect the formation and growth of the baby.  Do not douche or use tampons or scented sanitary pads.  Do not cross your legs for long periods of time.  To prepare for the arrival of your baby: ? Take prenatal classes to understand, practice, and ask questions about labor and delivery. ? Make a trial run to the hospital. ? Visit the hospital and tour the maternity area. ? Arrange for maternity or paternity leave through employers. ? Arrange for family and friends to take care of pets while you are in the hospital. ? Purchase a rear-facing car seat and make sure you know how to install it in your car. ? Pack your hospital bag. ? Prepare the baby's nursery. Make sure to remove all pillows and stuffed animals from the baby's crib to prevent suffocation.  Visit your dentist if you have not gone during your pregnancy. Use a soft toothbrush to brush your teeth and be gentle when you floss. Contact a health care provider if:  You are unsure if you are in labor or if your water has broken.  You become dizzy.  You have mild pelvic cramps, pelvic pressure, or nagging pain in your abdominal area.  You have lower back pain.  You have persistent  nausea, vomiting, or diarrhea.  You have an unusual or bad smelling vaginal discharge.  You have pain when you urinate. Get help right away if:  Your water breaks before 37 weeks.  You have regular contractions less than 5 minutes apart before 37 weeks.  You have a fever.  You are leaking fluid from your vagina.  You have spotting or bleeding from your vagina.  You have severe abdominal pain or cramping.  You have rapid weight loss or weight gain.    You have shortness of breath with chest pain.  You notice sudden or extreme swelling of your face, hands, ankles, feet, or legs.  Your baby makes fewer than 10 movements in 2 hours.  You have severe headaches that do not go away when you take medicine.  You have vision changes. Summary  The third trimester is from week 28 through week 40, months 7 through 9. The third trimester is a time when the unborn baby (fetus) is growing rapidly.  During the third trimester, your discomfort may increase as you and your baby continue to gain weight. You may have abdominal, leg, and back pain, sleeping problems, and an increased need to urinate.  During the third trimester your breasts will keep growing and they will continue to become tender. A yellow fluid (colostrum) may leak from your breasts. This is the first milk you are producing for your baby.  False labor is a condition in which you feel small, irregular tightenings of the muscles in the womb (contractions) that eventually go away. These are called Braxton Hicks contractions. Contractions may last for hours, days, or even weeks before true labor sets in.  Signs of labor can include: abdominal cramps; regular contractions that start at 10 minutes apart and become stronger and more frequent with time; watery or bloody mucus discharge that comes from the vagina; increased pelvic pressure and dull back pain; and leaking of amniotic fluid. This information is not intended to replace advice  given to you by your health care provider. Make sure you discuss any questions you have with your health care provider. Document Released: 08/17/2001 Document Revised: 01/29/2016 Document Reviewed: 10/24/2012 Elsevier Interactive Patient Education  2017 [employer]  

## 2018-04-21 NOTE — Addendum Note (Signed)
Addended by: Loran SentersJOHNSON, Freya Zobrist D on: 04/21/2018 10:42 AM   Modules accepted: Orders

## 2018-04-21 NOTE — Progress Notes (Signed)
No Concerns

## 2018-04-21 NOTE — Addendum Note (Signed)
Addended by: Donnetta HailARROYO, Lekeith Wulf on: 04/21/2018 10:34 AM   Modules accepted: Orders

## 2018-04-29 ENCOUNTER — Other Ambulatory Visit: Payer: Self-pay

## 2018-04-29 ENCOUNTER — Observation Stay: Payer: Medicaid Other

## 2018-04-29 ENCOUNTER — Observation Stay
Admission: EM | Admit: 2018-04-29 | Discharge: 2018-04-30 | Disposition: A | Discharge status: Home / Self Care | Payer: Medicaid Other | Attending: Obstetrics & Gynecology | Admitting: Obstetrics & Gynecology

## 2018-04-29 DIAGNOSIS — Z6841 Body Mass Index (BMI) 40.0 and over, adult: Secondary | ICD-10-CM

## 2018-04-29 DIAGNOSIS — O0990 Supervision of high risk pregnancy, unspecified, unspecified trimester: Secondary | ICD-10-CM

## 2018-04-29 DIAGNOSIS — Z3A33 33 weeks gestation of pregnancy: Secondary | ICD-10-CM | POA: Insufficient documentation

## 2018-04-29 DIAGNOSIS — O99213 Obesity complicating pregnancy, third trimester: Secondary | ICD-10-CM | POA: Insufficient documentation

## 2018-04-29 DIAGNOSIS — O4693 Antepartum hemorrhage, unspecified, third trimester: Principal | ICD-10-CM | POA: Insufficient documentation

## 2018-04-29 DIAGNOSIS — O4403 Placenta previa specified as without hemorrhage, third trimester: Secondary | ICD-10-CM

## 2018-04-29 DIAGNOSIS — O24410 Gestational diabetes mellitus in pregnancy, diet controlled: Secondary | ICD-10-CM | POA: Diagnosis not present

## 2018-04-29 LAB — FIBRINOGEN: FIBRINOGEN: 550 mg/dL — AB (ref 210–475)

## 2018-04-29 LAB — COMPREHENSIVE METABOLIC PANEL
ALT: 12 U/L (ref 0–44)
ANION GAP: 9 (ref 5–15)
AST: 19 U/L (ref 15–41)
Albumin: 3.1 g/dL — ABNORMAL LOW (ref 3.5–5.0)
Alkaline Phosphatase: 81 U/L (ref 38–126)
BILIRUBIN TOTAL: 1 mg/dL (ref 0.3–1.2)
BUN: 8 mg/dL (ref 6–20)
CHLORIDE: 110 mmol/L (ref 98–111)
CO2: 21 mmol/L — ABNORMAL LOW (ref 22–32)
Calcium: 9.1 mg/dL (ref 8.9–10.3)
Creatinine, Ser: 0.52 mg/dL (ref 0.44–1.00)
GFR calc Af Amer: >60 mL/min (ref >60)
GFR calc non Af Amer: >60 mL/min (ref >60)
GLUCOSE: 90 mg/dL (ref 70–99)
POTASSIUM: 3.6 mmol/L (ref 3.5–5.1)
SODIUM: 140 mmol/L (ref 135–145)
TOTAL PROTEIN: 6.2 g/dL — AB (ref 6.5–8.1)

## 2018-04-29 LAB — CBC
HEMATOCRIT: 35.5 % (ref 35.0–47.0)
Hemoglobin: 12.8 g/dL (ref 12.0–16.0)
MCH: 34.3 pg — ABNORMAL HIGH (ref 26.0–34.0)
MCHC: 36 g/dL (ref 32.0–36.0)
MCV: 95.3 fL (ref 80.0–100.0)
Platelets: 248 10*3/uL (ref 150–440)
RBC: 3.73 MIL/uL — ABNORMAL LOW (ref 3.80–5.20)
RDW: 14.9 % — AB (ref 11.5–14.5)
WBC: 8.9 10*3/uL (ref 3.6–11.0)

## 2018-04-29 LAB — GLUCOSE, CAPILLARY: Glucose-Capillary: 104 mg/dL — ABNORMAL HIGH (ref 70–99)

## 2018-04-29 LAB — APTT: APTT: 27 s (ref 24–36)

## 2018-04-29 LAB — KLEIHAUER-BETKE STAIN
Fetal Cells %: 0 %
Quantitation Fetal Hemoglobin: 0 mL

## 2018-04-29 LAB — ABO/RH: ABO/RH(D): O POS

## 2018-04-29 LAB — PROTIME-INR
INR: 1.04
PROTHROMBIN TIME: 13.5 s (ref 11.4–15.2)

## 2018-04-29 MED ORDER — OXYTOCIN 40 UNITS IN LACTATED RINGERS INFUSION - SIMPLE MED: 2.5000 [IU]/h | INTRAVENOUS | Status: DC

## 2018-04-29 MED ORDER — OXYTOCIN BOLUS FROM INFUSION: 500.0000 mL | Freq: Once | INTRAVENOUS | Status: DC

## 2018-04-29 MED ORDER — LACTATED RINGERS IV SOLN: INTRAVENOUS | Status: DC

## 2018-04-29 MED ORDER — LACTATED RINGERS IV SOLN: 500.0000 mL | INTRAVENOUS | Status: DC | PRN

## 2018-04-29 NOTE — H&P (Signed)
OB History & Physical   History of Present Illness:  Chief Complaint: bleeding  HPI:  Patricia Vazquez is a 32 y.o. 243P2002 female at 2146w2d dated by ultrasound.  Her pregnancy has been complicated by Obesity BMI >40, diet controlled gestational diabetes, low lying placenta, bleeding in third trimester.    She reports brief intermittent lower abdominal pains.   She denies leakage of fluid.   She reports vaginal bleeding.   She reports fetal movement.  She was at her baby shower this afternoon when she felt a gush of fluid and thought her water had broken. She discovered it was active bleeding that went down her legs. She felt several gushes and still had active bleeding on arrival to the hospital. She admits having intercourse 4 days ago. She reports that her blood sugars are within normal limits.   Maternal Medical History:   Past Medical History:  Diagnosis Date  . Gestational diabetes   . Obesity     Past Surgical History:  Procedure Laterality Date  . NO PAST SURGERIES      Allergies  Allergen Reactions  . Other     SEASONAL    Prior to Admission medications   Medication Sig Start Date End Date Taking? Authorizing Provider  Prenatal Vit-Fe Fumarate-FA (PRENATAL MULTIVITAMIN) TABS tablet Take 1 tablet by mouth daily at 12 noon.   Yes [provider]  glucose blood test strip Use as instructed Patient not taking: Reported on 04/20/2018 04/12/18   Oswaldo ConroySchmid, Jacelyn Y, CNM  prochlorperazine (COMPAZINE) 10 MG tablet Take 1 tablet (10 mg total) by mouth every 8 (eight) hours as needed (headache or nausea). Patient not taking: Reported on 03/24/2018 01/23/18   Vena AustriaStaebler, Andreas, MD  RELION LANCETS THIN 26G MISC 1 each by Does not apply route 4 (four) times daily. Patient not taking: Reported on 04/20/2018 04/12/18   Oswaldo ConroySchmid, Jacelyn Y, CNM    OB History  Patricia Vazquez  3 2 2     2   SAB TAB Ectopic Multiple Live Births          2    # Outcome Date GA Lbr  Len/2nd Weight Sex Delivery Anes PTL Lv  3 Current           2 Term 05/17/12 4663w0d  4111 g F Vag-Spont   LIV  1 Term 05/14/08 4163w0d  3204 g M Vag-Spont   LIV     Birth Comments: BABY C POLYCYTHEMIA, JAUNDICE, HYPOTHERMIA, AND HYPOGLYCEMIA, STAYED IN NICU X4-5DAYS    Prenatal care site: [facility]  Social History: She  reports that she has never smoked. She has never used smokeless tobacco. She reports that she does not drink alcohol or use drugs.  Family History: family history includes Diabetes in her maternal grandfather.    Review of Systems: Negative x 10 systems reviewed except as noted in the HPI.    Physical Exam:  Vital Signs: LMP 08/21/2017  Constitutional: Well nourished, well developed female in no acute distress.  HEENT: normal Skin: Warm and dry.  Cardiovascular: Regular rate and rhythm.   Extremity: reflexes 2+  Respiratory: Clear to auscultation bilateral. Normal respiratory effort Abdomen: FHT present, soft/non tender to palpation Back: no CVAT Neuro: DTRs 2+, Cranial nerves grossly intact Psych: Alert and Oriented x3. No memory deficits. Normal mood and affect.  MS: normal gait, normal bilateral lower extremity ROM/strength/stability.  Pelvic exam: sterile speculum exam/sve is limited by body habitus EGBUS: within normal limits,  clots of blood Vagina: within normal limits and with normal mucosa, small amount of pooling blood in the vault, no active bleeding Cervix: <0.5 cm/long  __________________________________________________________________________________________  CLINICAL DATA:  Vaginal bleeding for 45 minutes.  EXAM: LIMITED OBSTETRIC ULTRASOUND  FINDINGS: Number of Fetuses: 1  Heart Rate:  133 bpm  Movement: Yes  Presentation: Transverse head maternal RIGHT.  Placental Location: Anterior. Hypoechoic area along the inferior margin of the placenta, measuring 5.5 x 1.7 x 2 cm, possible bleed.  Previa: Marginal  Amniotic Fluid  (Subjective):  Within normal limits.  BPD: 8.43 cm 34 w  0 d  MATERNAL FINDINGS:  Cervix:  Appears closed.  Uterus/Adnexae: No abnormality visualized.  IMPRESSION: 1. Single live intrauterine pregnancy with estimated gestational age of [redacted] weeks and 0 days by BPD measurement. 2. Hypoechoic collection along the inferior margin of the placenta, measuring 5.5 x 1.7 x 2 cm, nonspecific appearance, could represent small marginal abruption or merely a prominent venous lake. 3. Marginal placenta previa. Inferior margin of the placenta is approximately 1.2 cm from the internal cervical os.  This exam is performed on an emergent basis and does not comprehensively evaluate fetal size, dating, or anatomy; follow-up complete OB US should be considered if further fetal assessment is warranted.   Electronically Signed   By: Bary Richard M.D.   On: 04/29/2018 16:50  ________________________________________________________________________________  Pertinent Results:  Prenatal Labs: Blood type/Rh O positive  Antibody screen negative  Rubella Immune  Varicella Immune    RPR Non-reactive  HBsAg negative  HIV negative  GC negative  Chlamydia negative  Genetic screening declined  1 hour GTT 150  3 hour GTT 3/4 elevated  GBS Not done    Baseline FHR: 130 beats/min   Variability: moderate   Accelerations: present   Decelerations: absent Contractions: none Overall assessment: Reassuring   Assessment:  Patricia Vazquez is a 32 y.o. G16P2002 female at [redacted]w[redacted]d with third trimester bleeding/stable, labs pending.   Plan:  1. Admit to Labor & Delivery for observation for further bleeding 2. CBC, PT, PTT, fibrinogen, KB 3. Regular diet   4. Fetal well-being: Category I   Tresea Mall, CNM 04/29/2018 4:52 PM

## 2018-04-29 NOTE — OB Triage Note (Signed)
Patient here for active bleeding 2 large gushes bright red denies pain denies LOF

## 2018-04-30 DIAGNOSIS — Z3A33 33 weeks gestation of pregnancy: Secondary | ICD-10-CM

## 2018-04-30 DIAGNOSIS — O4693 Antepartum hemorrhage, unspecified, third trimester: Secondary | ICD-10-CM | POA: Diagnosis not present

## 2018-04-30 DIAGNOSIS — O26853 Spotting complicating pregnancy, third trimester: Secondary | ICD-10-CM

## 2018-04-30 NOTE — Discharge Summary (Signed)
Physician Final Progress Note  Patient ID: Patricia Vazquez MRN: [id number] DOB/AGE: 1986-05-08 32 y.o.  Admit date: 04/29/2018 Admitting provider: Nadara Mustardobert P Harris, MD Discharge date: 04/30/2018   Admission Diagnoses: vaginal bleeding third trimester  Discharge Diagnoses:  Active Problems:   Indication for care in labor and delivery, antepartum   Vaginal bleeding in pregnancy, third trimester 32 yo G3P2 at 33 weeks 3 days with reactive NST, no current bleeding  History of Present Illness: The patient is a 32 y.o. female G3P2002 at 5262w3d who presents for bleeding yesterday afternoon that soaked pads prior to arrival at hospital with a small amount of bleeding after arriving at the hospital. She had some intermittent pain when she arrived here. She admitted good fetal movement throughout her stay. She denied any other fluid leaking. She did not have any other bleeding overnight and she has not had any contractions overnight. Discussed possibility of a small marginal abruption vs a prominent venous lake or bleeding from low lying placenta. She is discharged home with precautions.  Past Medical History:  Diagnosis Date  . Gestational diabetes   . Obesity     Past Surgical History:  Procedure Laterality Date  . NO PAST SURGERIES      No current facility-administered medications on file prior to encounter.    Current Outpatient Medications on File Prior to Encounter  Medication Sig Dispense Refill  . Prenatal Vit-Fe Fumarate-FA (PRENATAL MULTIVITAMIN) TABS tablet Take 1 tablet by mouth daily at 12 noon.    Marland Kitchen. glucose blood test strip Use as instructed (Patient not taking: Reported on 04/20/2018) 100 each 12  . prochlorperazine (COMPAZINE) 10 MG tablet Take 1 tablet (10 mg total) by mouth every 8 (eight) hours as needed (headache or nausea). (Patient not taking: Reported on 03/24/2018) 30 tablet 3  . RELION LANCETS THIN 26G MISC 1 each by Does not apply route 4 (four) times daily.  (Patient not taking: Reported on 04/20/2018) 100 each 3    Allergies  Allergen Reactions  . Other     SEASONAL    Social History   Socioeconomic History  . Marital status: Married    Spouse name: Not on file  . Number of children: 2  . Years of education: 4812  . Highest education level: Not on file  Occupational History  . Occupation: Conservation officer, [employer]  Social Needs  . Financial resource strain: Not on file  . Food insecurity:    Worry: Not on file    Inability: Not on file  . Transportation needs:    Medical: Not on file    Non-medical: Not on file  Tobacco Use  . Smoking status: Never Smoker  . Smokeless tobacco: Never Used  Substance and Sexual Activity  . Alcohol use: No    Frequency: Never  . Drug use: No  . Sexual activity: Yes    Partners: Male    Birth control/protection: None  Lifestyle  . Physical activity:    Days per week: 0 days    Minutes per session: 0 min  . Stress: Not at all  Relationships  . Social connections:    Talks on phone: More than three times a week    Gets together: More than three times a week    Attends religious service: More than 4 times per year    Active member of club or organization: No    Attends meetings of clubs or organizations: Never    Relationship status: Married  . Intimate partner violence:  Fear of current or ex partner: No    Emotionally abused: No    Physically abused: No    Forced sexual activity: No  Other Topics Concern  . Not on file  Social History Narrative  . Not on file    Physical Exam: LMP 08/21/2017   SpO2 (!) 78%   Gen: NAD CV: RRR Pulm: CTAB Pelvic: deferred Toco: negative Fetal well being: 130 bpm, moderate variability, +accelerations, -decelerations Ext: no evidence of DVT  Consults: None  Significant Findings/ Diagnostic Studies: labs:   Results for Patricia Vazquez, Patricia Vazquez (MRN [id number]) as of 04/30/2018 08:12  Ref. Range 04/29/2018 15:45 04/29/2018 16:26 04/29/2018 23:28  RBC Latest Ref  Range: 3.80 - 5.20 MIL/uL 3.73 (L)    Hemoglobin Latest Ref Range: 12.0 - 16.0 g/dL 40.9    HCT Latest Ref Range: 35.0 - 47.0 % 35.5    MCV Latest Ref Range: 80.0 - 100.0 fL 95.3    MCH Latest Ref Range: 26.0 - 34.0 pg 34.3 (H)    MCHC Latest Ref Range: 32.0 - 36.0 g/dL 81.1    RDW Latest Ref Range: 11.5 - 14.5 % 14.9 (H)    Platelets Latest Ref Range: 150 - 440 K/uL 248    Fibrinogen Latest Ref Range: 210 - 475 mg/dL 914 (H)    Prothrombin Time Latest Ref Range: 11.4 - 15.2 seconds 13.5    INR Unknown 1.04    APTT Latest Ref Range: 24 - 36 seconds 27    Glucose Latest Ref Range: 70 - 99 mg/dL 90    Fetal Cells % Latest Units: % 0    Quantitation Fetal Hemoglobin Latest Units: mL 0.0000    # Vials RhIg Unknown NOT INDICATED    ABO/RH(D) Unknown O POS...    US OB LIMITED Unknown  Rpt    CLINICAL DATA:  Vaginal bleeding for 45 minutes.  EXAM: LIMITED OBSTETRIC ULTRASOUND  FINDINGS: Number of Fetuses: 1  Heart Rate:  133 bpm  Movement: Yes  Presentation: Transverse head maternal RIGHT.  Placental Location: Anterior. Hypoechoic area along the inferior margin of the placenta, measuring 5.5 x 1.7 x 2 cm, possible bleed.  Previa: Marginal  Amniotic Fluid (Subjective):  Within normal limits.  BPD: 8.43 cm 34 w  0 d  MATERNAL FINDINGS:  Cervix:  Appears closed.  Uterus/Adnexae: No abnormality visualized.  IMPRESSION: 1. Single live intrauterine pregnancy with estimated gestational age of [redacted] weeks and 0 days by BPD measurement. 2. Hypoechoic collection along the inferior margin of the placenta, measuring 5.5 x 1.7 x 2 cm, nonspecific appearance, could represent small marginal abruption or merely a prominent venous lake. 3. Marginal placenta previa. Inferior margin of the placenta is approximately 1.2 cm from the internal cervical os.  This exam is performed on an emergent basis and does not comprehensively evaluate fetal size, dating, or anatomy;  follow-up complete OB US should be considered if further fetal assessment is warranted.   Electronically Signed   By: Bary Richard M.D.   On: 04/29/2018 16:50   Procedures: NST  Discharge Condition: good  Disposition: Discharge disposition: 01-Home or Self Care      Diet: Diabetic diet  Discharge Activity: No sex for 4 weeks and No heavy lifting for 4 weeks  Discharge Instructions    Discharge activity:   Complete by:  As directed    Activity as tolerated. Restrict lifting to 10 pounds.   Discharge diet:   Complete by:  As directed  Diabetic healthy diet   Fetal Kick Count:  Lie on our left side for one hour after a meal, and count the number of times your baby kicks.  If it is less than 5 times, get up, move around and drink some juice.  Repeat the test 30 minutes later.  If it is still less than 5 kicks in an hour, notify your doctor.   Complete by:  As directed    Notify physician for a general feeling that "something is not right"   Complete by:  As directed    Notify physician for increase or change in vaginal discharge   Complete by:  As directed    Notify physician for intestinal cramps, with or without diarrhea, sometimes described as "gas pain"   Complete by:  As directed    Notify physician for leaking of fluid   Complete by:  As directed    Notify physician for low, dull backache, unrelieved by heat or Tylenol   Complete by:  As directed    Notify physician for menstrual like cramps   Complete by:  As directed    Notify physician for pelvic pressure   Complete by:  As directed    Notify physician for uterine contractions.  These may be painless and feel like the uterus is tightening or the baby is  "balling up"   Complete by:  As directed    Notify physician for vaginal bleeding   Complete by:  As directed    PRETERM LABOR:  Includes any of the follwing symptoms that occur between 20 - [redacted] weeks gestation.  If these symptoms are not stopped, preterm  labor can result in preterm delivery, placing your baby at risk   Complete by:  As directed    Sexual Activity:     Complete by:  As directed    Pelvic rest remainder of pregnancy     Allergies as of 04/30/2018      Reactions   Other    SEASONAL      Medication List    STOP taking these medications   prochlorperazine 10 MG tablet Commonly known as:  COMPAZINE     TAKE these medications   glucose blood test strip Use as instructed   prenatal multivitamin Tabs tablet Take 1 tablet by mouth daily at 12 noon.   RELION LANCETS THIN 26G Misc 1 each by Does not apply route 4 (four) times daily.      Follow-up Information    [facility]. Go to.   Specialty:  Obstetrics and Gynecology Why:  regular scheduled prenatal appointment Contact information: [address] [place] [address] [phone]          Total time spent taking care of this patient: 45 minutes  Signed: Tresea Mall, CNM  04/30/2018, 8:07 AM

## 2018-05-01 ENCOUNTER — Ambulatory Visit: Payer: Medicaid Other | Admitting: Dietician

## 2018-05-05 ENCOUNTER — Encounter: Payer: Self-pay | Admitting: Maternal Newborn

## 2018-05-05 ENCOUNTER — Ambulatory Visit (INDEPENDENT_AMBULATORY_CARE_PROVIDER_SITE_OTHER): Payer: Medicaid Other

## 2018-05-05 ENCOUNTER — Ambulatory Visit (INDEPENDENT_AMBULATORY_CARE_PROVIDER_SITE_OTHER): Payer: Medicaid Other | Admitting: Maternal Newborn

## 2018-05-05 ENCOUNTER — Encounter: Payer: Medicaid Other | Admitting: Maternal Newborn

## 2018-05-05 VITALS — BP 110/80 | Wt 323.0 lb

## 2018-05-05 DIAGNOSIS — O24410 Gestational diabetes mellitus in pregnancy, diet controlled: Secondary | ICD-10-CM

## 2018-05-05 DIAGNOSIS — Z6841 Body Mass Index (BMI) 40.0 and over, adult: Secondary | ICD-10-CM

## 2018-05-05 DIAGNOSIS — Z3A34 34 weeks gestation of pregnancy: Secondary | ICD-10-CM

## 2018-05-05 DIAGNOSIS — Z369 Encounter for antenatal screening, unspecified: Secondary | ICD-10-CM

## 2018-05-05 DIAGNOSIS — O0990 Supervision of high risk pregnancy, unspecified, unspecified trimester: Secondary | ICD-10-CM

## 2018-05-05 DIAGNOSIS — O4403 Placenta previa specified as without hemorrhage, third trimester: Secondary | ICD-10-CM | POA: Diagnosis not present

## 2018-05-05 NOTE — Progress Notes (Signed)
Routine Prenatal Care Visit  Subjective  Patricia Vazquez is a 32 y.o. G3P2002 at [redacted]w[redacted]d being seen today for ongoing prenatal care.  She is currently monitored for the following issues for this high-risk pregnancy and has Supervision of high risk pregnancy, antepartum; BMI 45.0-49.9, adult (HCC); Obesity affecting pregnancy; Placenta previa; Gestational diabetes mellitus (GDM) in third trimester; and Vaginal bleeding in pregnancy, third trimester on their problem list.  ----------------------------------------------------------------------------------- Patient reports no further bleeding since she was seen in triage.   Contractions: Not present. Vag. Bleeding: None.  Movement: Present. No leaking of fluid.  ----------------------------------------------------------------------------------- The following portions of the patient's history were reviewed and updated as appropriate: allergies, current medications, past family history, past medical history, past social history, past surgical history and problem list. Problem list updated.   Objective  Blood pressure 110/80, weight (!) 323 lb (146.5 kg), last menstrual period 08/21/2017. Pregravid weight 320 lb (145.2 kg) Total Weight Gain 3 lb (1.361 kg) Body mass index is 49.11 kg/m.  Fetal Status: Fetal Heart Rate (bpm): 153   Movement: Present     General:  Alert, oriented and cooperative. Patient is in no acute distress.  Skin: Skin is warm and dry. No rash noted.   Cardiovascular: Normal heart rate noted  Respiratory: Normal respiratory effort, no problems with respiration noted  Abdomen: Soft, gravid, appropriate for gestational age. Pain/Pressure: Absent     Pelvic:  Cervical exam deferred        Extremities: Normal range of motion.     Mental Status: Normal mood and affect. Normal behavior. Normal judgment and thought content.     Assessment   32 y.o. [id number] at [redacted]w[redacted]d, EDD 06/15/2018 by Ultrasound presenting for routine  prenatal visit.  Plan   THIRD Problems (from 10/12/17 to present)    Problem Noted Resolved   Gestational diabetes mellitus (GDM) in third trimester 04/21/2018 by Oswaldo Conroy, CNM No   Overview Signed 04/21/2018  9:56 AM by Oswaldo Conroy, CNM    Current Diabetic Medications:  None  [ ]  Aspirin 81 mg daily after 12 weeks; discontinue after 36 weeks (? A2/B GDM)  Required Referrals for A1GDM or A2GDM: [x]  Diabetes Education and Testing Supplies [x]  Nutrition Cousult  For A2/B GDM or higher classes of DM [ ]  Diabetes Education and Testing Supplies [ ]  Nutrition Counsult [ ]  Fetal ECHO after 22-24 weeks  [ ]  Eye exam for retina evaluation  [ ]  Baseline EKG [ ]  US fetal growth every 4 weeks starting at 28 weeks [ ]  Twice weekly NST starting at [redacted] weeks gestation [ ]  Delivery planning contingent on fetal growth, AFI, glycemic control, and other co-morbidities but at least by 39 weeks  Baseline and surveillance labs (pulled in from [facility], refresh links as needed)  Lab Results  Component Value Date   CREATININE 0.76 10/22/2017   AST 17 10/22/2017   ALT 11 (L) 10/22/2017   No results found for: HGBA1C  Antenatal Testing Class of DM U/S NST/AFI DELIVERY  Diabetes   A1 - good control - O24.410    A2 - good control - O24.419      A2  - poor control or poor compliance - O24.419, E11.65   (Macrosomia or polyhydramnios) **E11.65 is extra code for poor control**    A2/B - O24.919  and B-C O24.319  Poor control B-C or D-R-F-T - O24.319  or  Type I DM - O24.019  20-38  20-38  20-24-28-32-36  20-24-28-32-35-38//fetal echo  20-24-27-30-33-36-38//fetal echo  40  32//2 x wk  32//2 x wk   32//2 x wk  28//BPP wkly then 32//2 x wk  40  39  PRN   39  PRN         Placenta previa 01/23/2018 by Vena AustriaStaebler, Andreas, MD No   Supervision of high risk pregnancy, antepartum 10/12/2017 by Oswaldo ConroySchmid, Jacelyn Y, CNM No   Overview Addendum 04/21/2018  9:57 AM by Oswaldo ConroySchmid,  Jacelyn Y, CNM    [facility] Prenatal Labs  Dating 7 week US Blood type: O/Positive/-- (02/08 1157)   Genetic Screen Declines Antibody:Negative (02/08 1157)  Anatomic US Complete 03/16/18 Rubella: 2.60 (02/08 1157) Varicella: Immune  GTT Early: 116               Third trimester: 3 hour abnormal, GDM RPR: Non Reactive (02/08 1157)   Rhogam N/A HBsAg: Negative (02/08 1157)   TDaP vaccine                       Flu Shot: HIV: Non Reactive (02/08 1157)   Baby Food  bottle, considering breast, problems before with supply  GBS:   Contraception Desires BTL Pap: 08/19/17, NILM/HPV Negative  CBB     CS/VBAC Not applicable   Support Person              BMI 45.0-49.9, adult (HCC) 10/12/2017 by Oswaldo ConroySchmid, Jacelyn Y, CNM No   Overview Signed 03/16/2018 11:30 AM by Oswaldo ConroySchmid, Jacelyn Y, CNM    BMI >=40 [ ]  early 1h gtt -  [ ]  u/s for dating [ ]   [ ]  nutritional goals [ ]  folic acid 1mg  [ ]  bASA (>12 weeks) [ ]  consider nutrition consult [ ]  consider maternal EKG 1st trimester [ ]  Growth u/s 28 [ ] , 32 [ ] , 36 weeks [ ]  [ ]  NST/AFI weekly 36+ weeks (36[] , 37[] , 38[] , 39[] , 40[] ) [ ]  IOL by 41 weeks (scheduled, prn [] )      Obesity affecting pregnancy 10/12/2017 by Oswaldo ConroySchmid, Jacelyn Y, CNM No   Overview Signed 03/24/2018  3:31 PM by Natale MilchSchuman, Christanna R, MD    BMI >=40 [ ]  early 1h gtt -  [ ]  u/s for dating [ ]   [ ]  nutritional goals [ ]  folic acid 1mg  [ ]  bASA (>12 weeks) [ ]  consider nutrition consult [ ]  consider maternal EKG 1st trimester [ ]  Growth u/s 28 [ ] , 32 [ ] , 36 weeks [ ]  [ ]  NST/AFI weekly 36+ weeks (36[] , 37[] , 38[] , 39[] , 40[] ) [ ]  IOL by 41 weeks (scheduled, prn [] )        Ultrasound today shows complete placenta previa. Patient counseled about need for Cesarean delivery with previa. AFI normal at 8.5 cm. Growth at 75.6th percentile [facility](AC 97.6th percentile) with EFW 6 lb 4 oz.  She does not have her glucose log today, reports normal fasting and postprandial values except one  postprandial value in the 130s after eating a carbohydrate rich food. She has a nutrition consult next week.  Advised to continue pelvic rest.  Return in about 2 weeks (around 05/19/2018) for ROB with NST/AFI.  Marcelyn BruinsJacelyn Schmid, CNM 05/05/2018

## 2018-05-05 NOTE — Progress Notes (Signed)
ROB and u/s- no concerns 

## 2018-05-09 ENCOUNTER — Ambulatory Visit: Payer: Medicaid Other | Admitting: Dietician

## 2018-05-11 ENCOUNTER — Encounter: Payer: Self-pay | Admitting: Dietician

## 2018-05-11 NOTE — Progress Notes (Signed)
Have not heard from patient to reschedule her second missed appointment from 05/09/18. Sent letter to referring provider.

## 2018-05-15 ENCOUNTER — Ambulatory Visit (INDEPENDENT_AMBULATORY_CARE_PROVIDER_SITE_OTHER): Payer: Medicaid Other | Admitting: Obstetrics & Gynecology

## 2018-05-15 ENCOUNTER — Telehealth: Payer: Self-pay | Admitting: Obstetrics & Gynecology

## 2018-05-15 ENCOUNTER — Telehealth: Payer: Self-pay

## 2018-05-15 VITALS — BP 100/60 | Wt 314.0 lb

## 2018-05-15 DIAGNOSIS — O4403 Placenta previa specified as without hemorrhage, third trimester: Secondary | ICD-10-CM

## 2018-05-15 DIAGNOSIS — Z3A35 35 weeks gestation of pregnancy: Secondary | ICD-10-CM

## 2018-05-15 DIAGNOSIS — O09293 Supervision of pregnancy with other poor reproductive or obstetric history, third trimester: Secondary | ICD-10-CM

## 2018-05-15 LAB — POCT URINALYSIS DIPSTICK OB
GLUCOSE, UA: NEGATIVE
POC,PROTEIN,UA: NEGATIVE

## 2018-05-15 NOTE — Patient Instructions (Signed)

## 2018-05-15 NOTE — Telephone Encounter (Signed)
Patient is schedule 05/15/18

## 2018-05-15 NOTE — Telephone Encounter (Signed)
Pt calling today states she has had some dark red/mucous when she wiped and was told to call if this happened to see if she needs a sooner appt. Please call and schedule her today or tomorrow if possible.

## 2018-05-15 NOTE — Progress Notes (Signed)
PRE-OPERATIVE HISTORY AND PHYSICAL EXAM  HPI:  Patricia Vazquez is a 32 y.o. [id number].  Patient's last menstrual period was 08/21/2017.  [redacted]w[redacted]d Estimated Date of Delivery: 06/15/18  She is being admitted for Previa.  Pt has had 2 bleeding episodes al;though most recent one 2 days ago was light and self-resolving.  No sexual activity.  No trauma.  No ROM or ctxs.  PNC significant for GDMA1, obesity.  Plans BTL.  PMHx: She  has a past medical history of Gestational diabetes and Obesity. Also,  has a past surgical history that includes No past surgeries., family history includes Diabetes in her maternal grandfather.,  reports that she has never smoked. She has never used smokeless tobacco. She reports that she does not drink alcohol or use drugs. OB History  Gravida Para Term Preterm AB Living  3 2 2     2   SAB TAB Ectopic Multiple Live Births          2    # Outcome Date GA Lbr Len/2nd Weight Sex Delivery Anes PTL Lv  3 Current           2 Term 05/17/12 [redacted]w[redacted]d  9 lb 1 oz (4.111 kg) F Vag-Spont   LIV  1 Term 05/14/08 [redacted]w[redacted]d  7 lb 1 oz (3.204 kg) M Vag-Spont   LIV     Birth Comments: BABY C POLYCYTHEMIA, JAUNDICE, HYPOTHERMIA, AND HYPOGLYCEMIA, STAYED IN NICU X4-5DAYS  Patient denies any other pertinent gynecologic issues. See prenatal record for more complete H&P   Current Outpatient Medications:  .  glucose blood test strip, Use as instructed, Disp: 100 each, Rfl: 12 .  Prenatal Vit-Fe Fumarate-FA (PRENATAL MULTIVITAMIN) TABS tablet, Take 1 tablet by mouth daily at 12 noon., Disp: , Rfl:  .  RELION LANCETS THIN 26G MISC, 1 each by Does not apply route 4 (four) times daily., Disp: 100 each, Rfl: 3 Also, is allergic to other.  Review of Systems  Constitutional: Negative for chills, fever and malaise/fatigue.  HENT: Negative for congestion, sinus pain and sore throat.   Eyes: Negative for blurred vision and pain.  Respiratory: Negative for cough and wheezing.   Cardiovascular: Negative  for chest pain and leg swelling.  Gastrointestinal: Negative for abdominal pain, constipation, diarrhea, heartburn, nausea and vomiting.  Genitourinary: Negative for dysuria, frequency, hematuria and urgency.  Musculoskeletal: Negative for back pain, joint pain, myalgias and neck pain.  Skin: Negative for itching and rash.  Neurological: Negative for dizziness, tremors and weakness.  Endo/Heme/Allergies: Does not bruise/bleed easily.  Psychiatric/Behavioral: Negative for depression. The patient is not nervous/anxious and does not have insomnia.     Objective: BP 100/60   Wt (!) 314 lb (142.4 kg)   LMP 08/21/2017   BMI 47.74 kg/m  Filed Weights   05/15/18 1350  Weight: (!) 314 lb (142.4 kg)   Physical Exam  Constitutional: She is oriented to person, place, and time. She appears well-developed and well-nourished. No distress.  HENT:  Head: Normocephalic and atraumatic. Head is without laceration.  Right Ear: Hearing normal.  Left Ear: Hearing normal.  Nose: No epistaxis.  No foreign bodies.  Mouth/Throat: Uvula is midline, oropharynx is clear and moist and mucous membranes are normal.  Eyes: Pupils are equal, round, and reactive to light.  Neck: Normal range of motion. Neck supple. No thyromegaly present.  Cardiovascular: Normal rate and regular rhythm. Exam reveals no gallop and no friction rub.  No murmur heard. Pulmonary/Chest: Effort normal  and breath sounds normal. No respiratory distress. She has no wheezes. Right breast exhibits no mass, no skin change and no tenderness. Left breast exhibits no mass, no skin change and no tenderness.  Abdominal: Soft. Bowel sounds are normal. She exhibits no distension. There is no tenderness. There is no rebound.  Musculoskeletal: Normal range of motion.  Neurological: She is alert and oriented to person, place, and time. No cranial nerve deficit.  Skin: Skin is warm and dry.  Psychiatric: She has a normal mood and affect. Judgment normal.    Vitals reviewed.  Assessment: 1. [redacted] weeks gestation of pregnancy   2. Placenta previa antepartum in third trimester    PLAN: 1.  Cesarean Delivery as Scheduled. 2.  Desires bilateral tubal ligation as well  Patient will undergo surgical management with Cesarean Section.   The risks of surgery were discussed in detail with the patient including but not limited to: bleeding which may require transfusion or reoperation; infection which may require antibiotics; injury to surrounding organs which may involve bowel, bladder, ureters ; need for additional procedures including laparoscopy or laparotomy; thromboembolic phenomenon, surgical site problems and other postoperative/anesthesia complications. Likelihood of success in alleviating the patient's condition was discussed. Routine postoperative instructions will be reviewed with the patient and her family in detail after surgery.  The patient concurred with the proposed plan, giving informed written consent for the surgery.  Patient will be NPO procedure.  Preoperative prophylactic antibiotics, as necessary, and SCDs ordered on call to the OR.  The patient has been fully informed about all methods of contraception, both temporary and permanent. She understands that tubal ligation is meant to be permanent, absolute and irreversible. She was told that there is an approximately 1 in 400 chance of a pregnancy in the future after tubal ligation. She was told the short and long term complications of tubal ligation. She understands the risks from this surgery include, but are not limited to, the risks of anesthesia, hemorrhage, infection, perforation, and injury to adjacent structures, bowel, bladder and blood vessels.     Annamarie Major, M.D. 05/15/2018 2:13 PM

## 2018-05-15 NOTE — H&P (View-Only) (Signed)
    PRE-OPERATIVE HISTORY AND PHYSICAL EXAM  HPI:  Patricia Vazquez is a 32 y.o. G3P2002.  Patient's last menstrual period was 08/21/2017.  [redacted]w[redacted]d Estimated Date of Delivery: 06/15/18  She is being admitted for Previa.  Pt has had 2 bleeding episodes al;though most recent one 2 days ago was light and self-resolving.  No sexual activity.  No trauma.  No ROM or ctxs.  PNC significant for GDMA1, obesity.  Plans BTL.  PMHx: She  has a past medical history of Gestational diabetes and Obesity. Also,  has a past surgical history that includes No past surgeries., family history includes Diabetes in her maternal grandfather.,  reports that she has never smoked. She has never used smokeless tobacco. She reports that she does not drink alcohol or use drugs. OB History  Gravida Para Term Preterm AB Living  3 2 2     2  SAB TAB Ectopic Multiple Live Births          2    # Outcome Date GA Lbr Len/2nd Weight Sex Delivery Anes PTL Lv  3 Current           2 Term 05/17/12 [redacted]w[redacted]d  9 lb 1 oz (4.111 kg) F Vag-Spont   LIV  1 Term 05/14/08 [redacted]w[redacted]d  7 lb 1 oz (3.204 kg) M Vag-Spont   LIV     Birth Comments: BABY C POLYCYTHEMIA, JAUNDICE, HYPOTHERMIA, AND HYPOGLYCEMIA, STAYED IN NICU X4-5DAYS  Patient denies any other pertinent gynecologic issues. See prenatal record for more complete H&P   Current Outpatient Medications:  .  glucose blood test strip, Use as instructed, Disp: 100 each, Rfl: 12 .  Prenatal Vit-Fe Fumarate-FA (PRENATAL MULTIVITAMIN) TABS tablet, Take 1 tablet by mouth daily at 12 noon., Disp: , Rfl:  .  RELION LANCETS THIN 26G MISC, 1 each by Does not apply route 4 (four) times daily., Disp: 100 each, Rfl: 3 Also, is allergic to other.  Review of Systems  Constitutional: Negative for chills, fever and malaise/fatigue.  HENT: Negative for congestion, sinus pain and sore throat.   Eyes: Negative for blurred vision and pain.  Respiratory: Negative for cough and wheezing.   Cardiovascular: Negative  for chest pain and leg swelling.  Gastrointestinal: Negative for abdominal pain, constipation, diarrhea, heartburn, nausea and vomiting.  Genitourinary: Negative for dysuria, frequency, hematuria and urgency.  Musculoskeletal: Negative for back pain, joint pain, myalgias and neck pain.  Skin: Negative for itching and rash.  Neurological: Negative for dizziness, tremors and weakness.  Endo/Heme/Allergies: Does not bruise/bleed easily.  Psychiatric/Behavioral: Negative for depression. The patient is not nervous/anxious and does not have insomnia.     Objective: BP 100/60   Wt (!) 314 lb (142.4 kg)   LMP 08/21/2017   BMI 47.74 kg/m  Filed Weights   05/15/18 1350  Weight: (!) 314 lb (142.4 kg)   Physical Exam  Constitutional: She is oriented to person, place, and time. She appears well-developed and well-nourished. No distress.  HENT:  Head: Normocephalic and atraumatic. Head is without laceration.  Right Ear: Hearing normal.  Left Ear: Hearing normal.  Nose: No epistaxis.  No foreign bodies.  Mouth/Throat: Uvula is midline, oropharynx is clear and moist and mucous membranes are normal.  Eyes: Pupils are equal, round, and reactive to light.  Neck: Normal range of motion. Neck supple. No thyromegaly present.  Cardiovascular: Normal rate and regular rhythm. Exam reveals no gallop and no friction rub.  No murmur heard. Pulmonary/Chest: Effort normal   and breath sounds normal. No respiratory distress. She has no wheezes. Right breast exhibits no mass, no skin change and no tenderness. Left breast exhibits no mass, no skin change and no tenderness.  Abdominal: Soft. Bowel sounds are normal. She exhibits no distension. There is no tenderness. There is no rebound.  Musculoskeletal: Normal range of motion.  Neurological: She is alert and oriented to person, place, and time. No cranial nerve deficit.  Skin: Skin is warm and dry.  Psychiatric: She has a normal mood and affect. Judgment normal.    Vitals reviewed.  Assessment: 1. [redacted] weeks gestation of pregnancy   2. Placenta previa antepartum in third trimester    PLAN: 1.  Cesarean Delivery as Scheduled. 2.  Desires bilateral tubal ligation as well  Patient will undergo surgical management with Cesarean Section.   The risks of surgery were discussed in detail with the patient including but not limited to: bleeding which may require transfusion or reoperation; infection which may require antibiotics; injury to surrounding organs which may involve bowel, bladder, ureters ; need for additional procedures including laparoscopy or laparotomy; thromboembolic phenomenon, surgical site problems and other postoperative/anesthesia complications. Likelihood of success in alleviating the patient's condition was discussed. Routine postoperative instructions will be reviewed with the patient and her family in detail after surgery.  The patient concurred with the proposed plan, giving informed written consent for the surgery.  Patient will be NPO procedure.  Preoperative prophylactic antibiotics, as necessary, and SCDs ordered on call to the OR.  The patient has been fully informed about all methods of contraception, both temporary and permanent. She understands that tubal ligation is meant to be permanent, absolute and irreversible. She was told that there is an approximately 1 in 400 chance of a pregnancy in the future after tubal ligation. She was told the short and long term complications of tubal ligation. She understands the risks from this surgery include, but are not limited to, the risks of anesthesia, hemorrhage, infection, perforation, and injury to adjacent structures, bowel, bladder and blood vessels.     Paul Nailea Whitehorn, M.D. 05/15/2018 2:13 PM  

## 2018-05-15 NOTE — Telephone Encounter (Signed)
Patient is aware of Pre-admit Testing appointment on Wed, 05/17/18 @ 7:30am, and OR on 05/18/18.

## 2018-05-15 NOTE — Progress Notes (Signed)
See note.  Counseled as to risks of placenta previa and need for cesarean delivery    Plan this week at 36 weeks.

## 2018-05-15 NOTE — Telephone Encounter (Signed)
-----   Message from Nadara Mustard, MD sent at 05/15/2018  2:19 PM EDT ----- Regarding: Surg Surgery Booking Request Patient Full Name:  Patricia Vazquez  MRN: [id number]  DOB: 05-29-1986  Surgeon: Letitia Libra, MD  Requested Surgery Date and Time: 9/12 Primary Diagnosis AND Code: Placenta Previa Secondary Diagnosis and Code: Sterility Surgical Procedure: Cesarean section with tubal ligation L&D Notification: Yes Admission Status: surgery admit Length of Surgery: 1hr Special Case Needs: no H&P: today (date) Phone Interview???: no Interpreter: Language:  Medical Clearance: no Special Scheduling Instructions: no  I have called L&D and OR today

## 2018-05-17 ENCOUNTER — Ambulatory Visit (INDEPENDENT_AMBULATORY_CARE_PROVIDER_SITE_OTHER): Payer: Medicaid Other | Admitting: Obstetrics & Gynecology

## 2018-05-17 ENCOUNTER — Encounter
Admission: RE | Admit: 2018-05-17 | Discharge: 2018-05-17 | Disposition: A | Discharge status: Home / Self Care | Payer: Medicaid Other | Source: Ambulatory Visit | Attending: Obstetrics & Gynecology | Admitting: Obstetrics & Gynecology

## 2018-05-17 ENCOUNTER — Other Ambulatory Visit: Payer: Self-pay

## 2018-05-17 VITALS — BP 100/70 | Wt 322.0 lb

## 2018-05-17 DIAGNOSIS — Z3A35 35 weeks gestation of pregnancy: Secondary | ICD-10-CM

## 2018-05-17 DIAGNOSIS — J309 Allergic rhinitis, unspecified: Secondary | ICD-10-CM | POA: Insufficient documentation

## 2018-05-17 LAB — CBC
HCT: 36 % (ref 35.0–47.0)
Hemoglobin: 12.6 g/dL (ref 12.0–16.0)
MCH: 33.5 pg (ref 26.0–34.0)
MCHC: 35 g/dL (ref 32.0–36.0)
MCV: 95.9 fL (ref 80.0–100.0)
PLATELETS: 216 10*3/uL (ref 150–440)
RBC: 3.76 MIL/uL — ABNORMAL LOW (ref 3.80–5.20)
RDW: 14.4 % (ref 11.5–14.5)
WBC: 8.6 10*3/uL (ref 3.6–11.0)

## 2018-05-17 LAB — TYPE AND SCREEN
ABO/RH(D): O POS
Antibody Screen: NEGATIVE
EXTEND SAMPLE REASON: UNDETERMINED

## 2018-05-17 MED ORDER — SODIUM CHLORIDE 0.9 % IV SOLN
2.0000 g | INTRAVENOUS | Status: AC
  Administered 2018-05-18: 2 g via INTRAVENOUS
  Filled 2018-05-17: qty 2

## 2018-05-17 MED ORDER — BUPIVACAINE 0.25 % ON-Q PUMP DUAL CATH 400 ML
400.0000 mL | INJECTION | Status: DC
  Filled 2018-05-17: qty 400

## 2018-05-17 NOTE — Pre-Procedure Instructions (Signed)
Patient was noted to have nasal congestion and cough. As c-section is scheduled tomorrow, I advised her to go to [facility] at [phone] to see her surgeon right after her PAT visit. I called and spoke with an office staff member who said she would relay the message to Dr. Tiburcio Pea and work her in.

## 2018-05-17 NOTE — Patient Instructions (Signed)
Your procedure is scheduled on: Thursday 05/18/18  Report to the ER in the morning.    Remember: Instructions that are not followed completely may result in serious medical risk, up to and including death, or upon the discretion of your surgeon and anesthesiologist your surgery may need to be rescheduled.     _X__ 1. Do not eat food after midnight the night before your procedure.                 No gum chewing or hard candies. You may drink clear liquids up to 2 hours                 before you are scheduled to arrive for your surgery- DO not drink clear                 liquids within 2 hours of the start of your surgery.                 Clear Liquids include:  water, apple juice without pulp, clear carbohydrate                 drink such as Clearfast or Gatorade, Black Coffee or Tea (Do not add                 anything to coffee or tea).  __X__2.  On the morning of surgery brush your teeth with toothpaste and water, you may rinse your mouth with mouthwash if you wish. Do not swallow any      toothpaste of mouthwash.     _X__ 3.  No Alcohol for 24 hours before or after surgery.   _X__ 4.  Do Not Smoke or use e-cigarettes For 24 Hours Prior to Your Surgery.                 Do not use any chewable tobacco products for at least 6 hours prior to                 surgery.  ____  5.  Bring all medications with you on the day of surgery if instructed.   __X__  6.  Notify your doctor if there is any change in your medical condition      (cold, fever, infections).     Do not wear jewelry, make-up, hairpins, clips or nail polish. Do not wear lotions, powders, or perfumes.  Do not shave 48 hours prior to surgery. Men may shave face and neck. Do not bring valuables to the hospital.    [facility] is not responsible for any belongings or valuables.  Contacts, dentures/partials or body piercings may not be worn into surgery. Bring a case for your contacts, glasses or hearing aids, a denture cup  will be supplied. Leave your suitcase in the car. After surgery it may be brought to your room. For patients admitted to the hospital, discharge time is determined by your treatment team.   Patients discharged the day of surgery will not be allowed to drive home.   Please read over the following fact sheets that you were given:   MRSA Information  __X__ Take these medicines the morning of surgery with A SIP OF WATER:     1.   2.   3.   4.  5.  6.    __X__ Use CHG Soap as directed

## 2018-05-17 NOTE — Progress Notes (Signed)
  Pt presents with worsening nasal congestion today and yesterday, some cough but no wheezing or SOB, no fever.  Pregnant [redacted] weeks as well, CS planned tomorrow for previa.  No h/o HTN.    PMHx: She  has a past medical history of Gestational diabetes and Obesity. Also,  has a past surgical history that includes No past surgeries., family history includes Diabetes in her maternal grandfather.,  reports that she has never smoked. She has never used smokeless tobacco. She reports that she does not drink alcohol or use drugs.  She has a current medication list which includes the following prescription(s): glucose blood, prenatal multivitamin, and relion lancets thin 26g. Also, is allergic to other.  Review of Systems  Constitutional: Negative for chills, fever and malaise/fatigue.  HENT: Positive for congestion and sinus pain. Negative for sore throat.   Eyes: Negative for blurred vision and pain.  Respiratory: Positive for cough. Negative for wheezing.   Cardiovascular: Negative for chest pain and leg swelling.  Gastrointestinal: Negative for abdominal pain, constipation, diarrhea, heartburn, nausea and vomiting.  Genitourinary: Negative for dysuria, frequency, hematuria and urgency.  Musculoskeletal: Negative for back pain, joint pain, myalgias and neck pain.  Skin: Negative for itching and rash.  Neurological: Negative for dizziness, tremors and weakness.  Endo/Heme/Allergies: Does not bruise/bleed easily.  Psychiatric/Behavioral: Negative for depression. The patient is not nervous/anxious and does not have insomnia.     Objective: BP 100/70   Wt (!) 322 lb (146.1 kg)   LMP 08/21/2017   BMI 47.55 kg/m  Physical Exam  Constitutional: She is oriented to person, place, and time. She appears well-developed and well-nourished. No distress.  HENT:  Head: Normocephalic and atraumatic.  Right Ear: Hearing normal.  Left Ear: Hearing normal.  Nose: Nose normal. Right sinus exhibits no frontal  sinus tenderness. Left sinus exhibits no frontal sinus tenderness.  Mouth/Throat: Uvula is midline, oropharynx is clear and moist and mucous membranes are normal.  Musculoskeletal: Normal range of motion.  Neurological: She is alert and oriented to person, place, and time.  Skin: Skin is warm and dry.  Psychiatric: She has a normal mood and affect.  Vitals reviewed.   ASSESSMENT/PLAN:   Problem List Items Addressed This Visit      Respiratory   Allergic sinusitis - Primary    Sudafed, Robitussin Monitor for fever  Annamarie Major, MD, [facility], [facility] 05/17/2018  8:48 AM

## 2018-05-18 ENCOUNTER — Encounter
Admission: RE | Disposition: A | Discharge status: Home / Self Care | Payer: Self-pay | Source: Home / Self Care | Attending: Obstetrics & Gynecology

## 2018-05-18 ENCOUNTER — Inpatient Hospital Stay: Payer: Medicaid Other | Admitting: Certified Registered Nurse Anesthetist

## 2018-05-18 ENCOUNTER — Encounter: Payer: Self-pay | Admitting: Anesthesiology

## 2018-05-18 ENCOUNTER — Inpatient Hospital Stay
Admission: RE | Admit: 2018-05-18 | Discharge: 2018-05-20 | DRG: 784 | Disposition: A | Discharge status: Home / Self Care | Payer: Medicaid Other | Attending: Obstetrics & Gynecology | Admitting: Obstetrics & Gynecology

## 2018-05-18 DIAGNOSIS — Z98891 History of uterine scar from previous surgery: Secondary | ICD-10-CM

## 2018-05-18 DIAGNOSIS — O99210 Obesity complicating pregnancy, unspecified trimester: Secondary | ICD-10-CM | POA: Diagnosis present

## 2018-05-18 DIAGNOSIS — O24419 Gestational diabetes mellitus in pregnancy, unspecified control: Secondary | ICD-10-CM | POA: Diagnosis present

## 2018-05-18 DIAGNOSIS — O322XX0 Maternal care for transverse and oblique lie, not applicable or unspecified: Secondary | ICD-10-CM | POA: Diagnosis present

## 2018-05-18 DIAGNOSIS — O24410 Gestational diabetes mellitus in pregnancy, diet controlled: Secondary | ICD-10-CM

## 2018-05-18 DIAGNOSIS — D62 Acute posthemorrhagic anemia: Secondary | ICD-10-CM | POA: Diagnosis not present

## 2018-05-18 DIAGNOSIS — Z6841 Body Mass Index (BMI) 40.0 and over, adult: Secondary | ICD-10-CM

## 2018-05-18 DIAGNOSIS — O4403 Placenta previa specified as without hemorrhage, third trimester: Secondary | ICD-10-CM

## 2018-05-18 DIAGNOSIS — O4400 Placenta previa specified as without hemorrhage, unspecified trimester: Secondary | ICD-10-CM | POA: Diagnosis present

## 2018-05-18 DIAGNOSIS — Z3A35 35 weeks gestation of pregnancy: Secondary | ICD-10-CM | POA: Diagnosis not present

## 2018-05-18 DIAGNOSIS — O9081 Anemia of the puerperium: Secondary | ICD-10-CM | POA: Diagnosis not present

## 2018-05-18 DIAGNOSIS — O24420 Gestational diabetes mellitus in childbirth, diet controlled: Secondary | ICD-10-CM | POA: Diagnosis present

## 2018-05-18 DIAGNOSIS — Z3A36 36 weeks gestation of pregnancy: Secondary | ICD-10-CM | POA: Diagnosis not present

## 2018-05-18 DIAGNOSIS — O99214 Obesity complicating childbirth: Secondary | ICD-10-CM | POA: Diagnosis present

## 2018-05-18 DIAGNOSIS — O0990 Supervision of high risk pregnancy, unspecified, unspecified trimester: Secondary | ICD-10-CM

## 2018-05-18 DIAGNOSIS — O99213 Obesity complicating pregnancy, third trimester: Secondary | ICD-10-CM

## 2018-05-18 DIAGNOSIS — Z302 Encounter for sterilization: Secondary | ICD-10-CM

## 2018-05-18 LAB — GLUCOSE, CAPILLARY
GLUCOSE-CAPILLARY: 86 mg/dL (ref 70–99)
GLUCOSE-CAPILLARY: 92 mg/dL (ref 70–99)

## 2018-05-18 SURGERY — Surgical Case
Anesthesia: Spinal

## 2018-05-18 MED ORDER — SODIUM CHLORIDE 0.9 % IV SOLN
INTRAVENOUS | Status: DC | PRN
  Administered 2018-05-18: 100 ug via INTRAVENOUS

## 2018-05-18 MED ORDER — MORPHINE SULFATE (PF) 2 MG/ML IV SOLN: 1.0000 mg | INTRAVENOUS | Status: DC | PRN

## 2018-05-18 MED ORDER — BUPIVACAINE HCL 0.25 % IJ SOLN
INTRAMUSCULAR | Status: DC | PRN
  Administered 2018-05-18: 10 mL

## 2018-05-18 MED ORDER — PRENATAL MULTIVITAMIN CH
1.0000 | ORAL_TABLET | Freq: Every day | ORAL | Status: DC
  Administered 2018-05-18 – 2018-05-20 (×3): 1 via ORAL
  Filled 2018-05-18 (×4): qty 1

## 2018-05-18 MED ORDER — ZOLPIDEM TARTRATE 5 MG PO TABS: 5.0000 mg | ORAL_TABLET | Freq: Every evening | ORAL | Status: DC | PRN

## 2018-05-18 MED ORDER — EPHEDRINE SULFATE 50 MG/ML IJ SOLN
INTRAMUSCULAR | Status: DC | PRN
  Administered 2018-05-18: 5 mg via INTRAVENOUS

## 2018-05-18 MED ORDER — BUPIVACAINE HCL 0.5 % IJ SOLN
10.0000 mL | Freq: Once | INTRAMUSCULAR | Status: DC
  Filled 2018-05-18 (×3): qty 10

## 2018-05-18 MED ORDER — WITCH HAZEL-GLYCERIN EX PADS: 1.0000 "application " | MEDICATED_PAD | CUTANEOUS | Status: DC | PRN

## 2018-05-18 MED ORDER — LIDOCAINE HCL (PF) 1 % IJ SOLN
INTRAMUSCULAR | Status: DC | PRN
  Administered 2018-05-18: 2 mL via SUBCUTANEOUS

## 2018-05-18 MED ORDER — ACETAMINOPHEN 325 MG PO TABS
650.0000 mg | ORAL_TABLET | ORAL | Status: DC | PRN
  Filled 2018-05-18: qty 2

## 2018-05-18 MED ORDER — DIBUCAINE 1 % RE OINT: 1.0000 "application " | TOPICAL_OINTMENT | RECTAL | Status: DC | PRN

## 2018-05-18 MED ORDER — FENTANYL CITRATE (PF) 100 MCG/2ML IJ SOLN
INTRAMUSCULAR | Status: DC | PRN
  Administered 2018-05-18: 15 ug via INTRAVENOUS

## 2018-05-18 MED ORDER — DIPHENHYDRAMINE HCL 25 MG PO CAPS
25.0000 mg | ORAL_CAPSULE | Freq: Four times a day (QID) | ORAL | Status: DC | PRN
  Filled 2018-05-18: qty 1

## 2018-05-18 MED ORDER — TETANUS-DIPHTH-ACELL PERTUSSIS 5-2.5-18.5 LF-MCG/0.5 IM SUSP
0.5000 mL | Freq: Once | INTRAMUSCULAR | Status: DC
  Filled 2018-05-18: qty 0.5

## 2018-05-18 MED ORDER — SIMETHICONE 80 MG PO CHEW
80.0000 mg | CHEWABLE_TABLET | ORAL | Status: DC
  Filled 2018-05-18: qty 1

## 2018-05-18 MED ORDER — OXYCODONE-ACETAMINOPHEN 5-325 MG PO TABS
1.0000 | ORAL_TABLET | ORAL | Status: DC | PRN
  Administered 2018-05-19 – 2018-05-20 (×5): 1 via ORAL
  Filled 2018-05-18 (×4): qty 1

## 2018-05-18 MED ORDER — FENTANYL CITRATE (PF) 100 MCG/2ML IJ SOLN: INTRAMUSCULAR | Status: DC | PRN

## 2018-05-18 MED ORDER — BUPIVACAINE IN DEXTROSE 0.75-8.25 % IT SOLN
INTRATHECAL | Status: DC | PRN
  Administered 2018-05-18: 1.6 mL via INTRATHECAL

## 2018-05-18 MED ORDER — SENNOSIDES-DOCUSATE SODIUM 8.6-50 MG PO TABS
2.0000 | ORAL_TABLET | ORAL | Status: DC
  Administered 2018-05-19 – 2018-05-20 (×2): 2 via ORAL
  Filled 2018-05-18 (×3): qty 2

## 2018-05-18 MED ORDER — OXYTOCIN 40 UNITS IN LACTATED RINGERS INFUSION - SIMPLE MED
INTRAVENOUS | Status: DC | PRN
  Administered 2018-05-18: 1000 mL via INTRAVENOUS

## 2018-05-18 MED ORDER — SOD CITRATE-CITRIC ACID 500-334 MG/5ML PO SOLN
30.0000 mL | ORAL | Status: AC
  Administered 2018-05-18: 30 mL via ORAL
  Filled 2018-05-18: qty 15

## 2018-05-18 MED ORDER — FENTANYL CITRATE (PF) 100 MCG/2ML IJ SOLN
INTRAMUSCULAR | Status: AC
  Filled 2018-05-18: qty 2

## 2018-05-18 MED ORDER — KETOROLAC TROMETHAMINE 30 MG/ML IJ SOLN
INTRAMUSCULAR | Status: DC | PRN
  Administered 2018-05-18: 30 mg via INTRAVENOUS

## 2018-05-18 MED ORDER — DIPHENHYDRAMINE HCL 50 MG/ML IJ SOLN
12.5000 mg | Freq: Once | INTRAMUSCULAR | Status: AC
  Administered 2018-05-18: 12.5 mg via INTRAVENOUS
  Filled 2018-05-18: qty 1

## 2018-05-18 MED ORDER — LACTATED RINGERS IV SOLN
INTRAVENOUS | Status: DC
  Administered 2018-05-18 (×2): via INTRAVENOUS

## 2018-05-18 MED ORDER — OXYTOCIN 40 UNITS IN LACTATED RINGERS INFUSION - SIMPLE MED
2.5000 [IU]/h | INTRAVENOUS | Status: AC
  Administered 2018-05-18: 2.5 [IU]/h via INTRAVENOUS

## 2018-05-18 MED ORDER — OXYCODONE-ACETAMINOPHEN 5-325 MG PO TABS
2.0000 | ORAL_TABLET | ORAL | Status: DC | PRN
  Administered 2018-05-19: 2 via ORAL
  Filled 2018-05-18 (×2): qty 2

## 2018-05-18 MED ORDER — BUPIVACAINE IN DEXTROSE 0.75-8.25 % IT SOLN
INTRATHECAL | Status: AC
  Filled 2018-05-18: qty 2

## 2018-05-18 MED ORDER — ONDANSETRON HCL 4 MG/2ML IJ SOLN
INTRAMUSCULAR | Status: DC | PRN
  Administered 2018-05-18: 4 mg via INTRAVENOUS

## 2018-05-18 MED ORDER — OXYTOCIN 40 UNITS IN LACTATED RINGERS INFUSION - SIMPLE MED
INTRAVENOUS | Status: AC
  Filled 2018-05-18: qty 1000

## 2018-05-18 MED ORDER — SIMETHICONE 80 MG PO CHEW
80.0000 mg | CHEWABLE_TABLET | Freq: Three times a day (TID) | ORAL | Status: DC
  Administered 2018-05-18 – 2018-05-20 (×6): 80 mg via ORAL
  Filled 2018-05-18 (×9): qty 1

## 2018-05-18 MED ORDER — COCONUT OIL OIL: 1.0000 "application " | TOPICAL_OIL | Status: DC | PRN

## 2018-05-18 MED ORDER — SIMETHICONE 80 MG PO CHEW
80.0000 mg | CHEWABLE_TABLET | ORAL | Status: DC | PRN
  Administered 2018-05-18: 80 mg via ORAL
  Filled 2018-05-18 (×2): qty 1

## 2018-05-18 MED ORDER — PROPOFOL 10 MG/ML IV BOLUS
INTRAVENOUS | Status: AC
  Filled 2018-05-18: qty 20

## 2018-05-18 MED ORDER — LACTATED RINGERS IV SOLN
INTRAVENOUS | Status: DC
  Administered 2018-05-18 – 2018-05-19 (×2): via INTRAVENOUS

## 2018-05-18 MED ORDER — MORPHINE SULFATE (PF) 0.5 MG/ML IJ SOLN
INTRAMUSCULAR | Status: AC
  Filled 2018-05-18: qty 10

## 2018-05-18 MED ORDER — SODIUM CHLORIDE 0.9 % IV SOLN
INTRAVENOUS | Status: DC | PRN
  Administered 2018-05-18: 50 ug/min via INTRAVENOUS

## 2018-05-18 MED ORDER — MORPHINE SULFATE (PF) 0.5 MG/ML IJ SOLN
INTRAMUSCULAR | Status: DC | PRN
  Administered 2018-05-18: .1 mg via INTRATHECAL

## 2018-05-18 MED ORDER — MENTHOL 3 MG MT LOZG
1.0000 | LOZENGE | OROMUCOSAL | Status: DC | PRN
  Filled 2018-05-18: qty 9

## 2018-05-18 MED ORDER — KETOROLAC TROMETHAMINE 30 MG/ML IJ SOLN
30.0000 mg | Freq: Four times a day (QID) | INTRAMUSCULAR | Status: AC
  Administered 2018-05-18 – 2018-05-19 (×3): 30 mg via INTRAVENOUS
  Filled 2018-05-18 (×3): qty 1

## 2018-05-18 SURGICAL SUPPLY — 24 items
BARRIER ADHS 3X4 INTERCEED (GAUZE/BANDAGES/DRESSINGS) ×3 IMPLANT
CANISTER SUCT 3000ML PPV (MISCELLANEOUS) ×3 IMPLANT
CATH KIT ON-Q SILVERSOAK 5IN (CATHETERS) ×6 IMPLANT
CHLORAPREP W/TINT 26ML (MISCELLANEOUS) ×6 IMPLANT
DERMABOND ADVANCED (GAUZE/BANDAGES/DRESSINGS) ×2
DERMABOND ADVANCED .7 DNX12 (GAUZE/BANDAGES/DRESSINGS) ×1 IMPLANT
ELECT CAUTERY BLADE 6.4 (BLADE) IMPLANT
ELECT REM PT RETURN 9FT ADLT (ELECTROSURGICAL) ×3
ELECTRODE REM PT RTRN 9FT ADLT (ELECTROSURGICAL) ×1 IMPLANT
GLOVE SKINSENSE NS SZ8.0 LF (GLOVE) ×2
GLOVE SKINSENSE STRL SZ8.0 LF (GLOVE) ×1 IMPLANT
GOWN STRL REUS W/ TWL LRG LVL3 (GOWN DISPOSABLE) ×1 IMPLANT
GOWN STRL REUS W/ TWL XL LVL3 (GOWN DISPOSABLE) ×2 IMPLANT
GOWN STRL REUS W/TWL LRG LVL3 (GOWN DISPOSABLE) ×2
GOWN STRL REUS W/TWL XL LVL3 (GOWN DISPOSABLE) ×4
NS IRRIG 1000ML POUR BTL (IV SOLUTION) ×3 IMPLANT
PACK C SECTION AR (MISCELLANEOUS) ×3 IMPLANT
PAD OB MATERNITY 4.3X12.25 (PERSONAL CARE ITEMS) ×3 IMPLANT
PAD PREP 24X41 OB/GYN DISP (PERSONAL CARE ITEMS) ×3 IMPLANT
SUT MAXON ABS #0 GS21 30IN (SUTURE) ×6 IMPLANT
SUT PLAIN 2 0 XLH (SUTURE) ×3 IMPLANT
SUT VIC AB 1 CT1 36 (SUTURE) ×18 IMPLANT
SUT VIC AB 2-0 CT1 36 (SUTURE) ×3 IMPLANT
SUT VIC AB 4-0 FS2 27 (SUTURE) ×3 IMPLANT

## 2018-05-18 NOTE — Transfer of Care (Signed)
Immediate Anesthesia Transfer of Care Note  Patient: Patricia Vazquez  Procedure(s) Performed: CESAREAN SECTION WITH BILATERAL TUBAL LIGATION (N/A )  Patient Location: PACU and Mother/Baby  Anesthesia Type:Spinal  Level of Consciousness: awake  Airway & Oxygen Therapy: Patient Spontanous Breathing  Post-op Assessment: Report given to RN and Post -op Vital signs reviewed and stable  Post vital signs: Reviewed and stable  Last Vitals:  Vitals Value Taken Time  BP 88/78 05/18/2018 10:32 AM  Temp 36.4 C 05/18/2018 10:32 AM  Pulse 112 05/18/2018 10:32 AM  Resp    SpO2      Last Pain:  Vitals:   05/18/18 1032  TempSrc: Tympanic  PainSc: 0-No pain         Complications: No apparent anesthesia complications

## 2018-05-18 NOTE — Discharge Summary (Signed)
OB Discharge Summary     Patient Name: Patricia Vazquez DOB: 04/08/1986 MRN: [id number]  Date of admission: 05/18/2018 Delivering MD: Letitia Libraobert Paul Harris, MD  Date of Delivery: 05/18/2018  Date of discharge: 05/20/2018  Admitting diagnosis: PLACENTA PREVIA, STERILITY Intrauterine pregnancy: 3816w0d     Secondary diagnosis: Transverse Lie, Gestational Diabetes     Discharge diagnosis: Placenta Previs, Sterility, Transverse Lie, Gestational Diabetes, No other diagnosis                         Hospital course:  Sceduled C/S   32 y.o. yo G3P2103 at 5216w0d was admitted to the hospital 05/18/2018 for scheduled cesarean section with the following indication:Malpresentation and Previa.  Membrane Rupture Time/Date: 9:29 AM ,05/18/2018   Patient delivered a Viable infant.05/18/2018  Details of operation can be found in separate operative note.  Pateint had an uncomplicated postpartum course.  She is ambulating, tolerating a regular diet, passing flatus, and urinating well. Patient is discharged home in stable condition on  05/20/18                                                                        Post partum procedures:none  Complications: None  Physical exam on 05/20/2018: Vitals:   05/19/18 0830 05/19/18 1621 05/19/18 2333 05/20/18 0728  BP: 104/68 120/71 98/67 127/90  Pulse: 97 (!) 102 (!) 105 96  Resp: 17 15 18 19   Temp: (!) 97.5 F (36.4 C) 98.4 F (36.9 C) 98.1 F (36.7 C) 98.1 F (36.7 C)  TempSrc: Oral Oral Oral Oral  SpO2: 98% 100% 100% 100%  Weight:      Height:       General: alert Lochia: appropriate Uterine Fundus: firm Incision: Healing well with no significant drainage DVT Evaluation: No evidence of DVT seen on physical exam.  Labs: Lab Results  Component Value Date   WBC 10.2 05/19/2018   HGB 10.2 (L) 05/19/2018   HCT 28.9 (L) 05/19/2018   MCV 95.9 05/19/2018   PLT 184 05/19/2018   CMP Latest Ref Rng & Units 04/29/2018  Glucose 70 - 99 mg/dL 90  BUN 6 - 20  mg/dL 8  Creatinine 4.690.44 - 6.291.00 mg/dL 5.280.52  Sodium 413135 - 244145 mmol/L 140  Potassium 3.5 - 5.1 mmol/L 3.6  Chloride 98 - 111 mmol/L 110  CO2 22 - 32 mmol/L 21(L)  Calcium 8.9 - 10.3 mg/dL 9.1  Total Protein 6.5 - 8.1 g/dL 6.2(L)  Total Bilirubin 0.3 - 1.2 mg/dL 1.0  Alkaline Phos 38 - 126 U/L 81  AST 15 - 41 U/L 19  ALT 0 - 44 U/L 12    Discharge instruction: per After Visit Summary.  Medications:  Allergies as of 05/20/2018      Reactions   Other    SEASONAL      Medication List    TAKE these medications   docusate sodium 100 MG capsule Commonly known as:  COLACE Take 1 capsule (100 mg total) by mouth daily as needed.   ibuprofen 600 MG tablet Commonly known as:  ADVIL,MOTRIN Take 1 tablet (600 mg total) by mouth every 6 (six) hours as needed for fever or headache.   oxyCODONE-acetaminophen 5-325 MG  tablet Commonly known as:  PERCOCET/ROXICET Take 1 tablet by mouth every 6 (six) hours as needed for up to 5 days (pain scale 4-7).   prenatal multivitamin Tabs tablet Take 1 tablet by mouth daily at 12 noon.            Discharge Care Instructions  (From admission, onward)         Start     Ordered   05/20/18 0000  Discharge wound care:    Comments:  SHOWER DAILY After bandage is removed in office or if it comes off on it's own wash the incision gently with soap and water.  Call office with any drainage, redness, or firmness of the incision.   05/20/18 1208          Diet: routine diet  Activity: Advance as tolerated. Pelvic rest for 6 weeks.   Outpatient follow up: Follow-up Information    Nadara Mustard, MD. Schedule an appointment as soon as possible for a visit in 1 week(s).   Specialty:  Obstetrics and Gynecology Why:  Please call to schedule a 1 week postpartum follow up appointment with Dr. Tiburcio Pea for an incision check Contact information: [address] [place] [address] [phone]             Postpartum contraception: Tubal  Ligation Rhogam Given postpartum: no Rubella vaccine given postpartum: no Varicella vaccine given postpartum: no TDaP given antepartum or postpartum: No  Newborn Data: Live born female  Birth Weight: 7 lb 9.7 oz (3450 g) APGAR: 8, 9  Newborn Delivery   Birth date/time:  05/18/2018 09:30:00 Delivery type:  C-Section, Low Transverse C-section categorization:  Primary      Baby Feeding: Bottle  Disposition:home with mother  SIGNED:   Natale Milch, MD 05/20/2018 12:09 PM

## 2018-05-18 NOTE — Anesthesia Post-op Follow-up Note (Signed)
Anesthesia QCDR form completed.        

## 2018-05-18 NOTE — Anesthesia Preprocedure Evaluation (Signed)
Anesthesia Evaluation  Patient identified by MRN, date of birth, ID band Patient awake    Reviewed: Allergy & Precautions, H&P , NPO status , Patient's Chart, lab work & pertinent test results  History of Anesthesia Complications Negative for: history of anesthetic complications  Airway Mallampati: II  TM Distance: >3 FB Neck ROM: full    Dental  (+) Chipped   Pulmonary neg pulmonary ROS, neg shortness of breath,           Cardiovascular Exercise Tolerance: Good (-) hypertension(-) angina(-) Past MI and (-) DOE negative cardio ROS       Neuro/Psych    GI/Hepatic negative GI ROS, neg GERD  ,  Endo/Other  diabetes, GestationalMorbid obesity  Renal/GU   negative genitourinary   Musculoskeletal   Abdominal   Peds  Hematology negative hematology ROS (+)   Anesthesia Other Findings Past Medical History: No date: Gestational diabetes No date: Obesity  Past Surgical History: No date: NO PAST SURGERIES     Reproductive/Obstetrics (+) Pregnancy                             Anesthesia Physical Anesthesia Plan  ASA: III  Anesthesia Plan: Spinal   Post-op Pain Management:    Induction:   PONV Risk Score and Plan:   Airway Management Planned: Natural Airway and Nasal Cannula  Additional Equipment:   Intra-op Plan:   Post-operative Plan:   Informed Consent: I have reviewed the patients History and Physical, chart, labs and discussed the procedure including the risks, benefits and alternatives for the proposed anesthesia with the patient or authorized representative who has indicated his/her understanding and acceptance.   Dental Advisory Given  Plan Discussed with: Anesthesiologist, CRNA and Surgeon  Anesthesia Plan Comments: (Patient reports no bleeding problems and no anticoagulant use.  Plan for spinal with backup GA  Patient consented for risks of anesthesia including  but not limited to:  - adverse reactions to medications - risk of bleeding, infection, nerve damage and headache - risk of failed spinal - damage to teeth, lips or other oral mucosa - sore throat or hoarseness - Damage to heart, brain, lungs or loss of life  Patient voiced understanding.)        Anesthesia Quick Evaluation

## 2018-05-18 NOTE — Op Note (Signed)
Cesarean Section Procedure Note Indications: placenta previa and 36 weeks and gestational diabetes, Desire for permanent sterility  Pre-operative Diagnosis: Intrauterine pregnancy [redacted]w[redacted]d ;  placenta previa and gestational diabetes, Desire for permanent sterility Post-operative Diagnosis: same, delivered. Procedure: Low Transverse Cesarean Section, Bilateral Tubal Ligation Surgeon: Annamarie Major, MD Assistant(s): Dr Edison Pace, No other capable assistant available, in surgery requiring high level assistant. Anesthesia: Spinal anesthesia Estimated Blood Loss:1000 Complications: None; patient tolerated the procedure well. Disposition: PACU - hemodynamically stable. Condition: stable  Findings: A female infant in the transverse presentation. Amniotic fluid - Clear  Birth weight 7-10lbs.  Apgars of 8 and 9.  Intact placenta with a three-vessel cord. Grossly normal uterus, tubes and ovaries bilaterally. No intraabdominal adhesions were noted.  Procedure Details   The patient was taken to Operating Room, identified as the correct patient and the procedure verified as C-Section Delivery. A Time Out was held and the above information confirmed. After induction of anesthesia, the patient was draped and prepped in the usual sterile manner. A Pfannenstiel incision was made and carried down through the subcutaneous tissue to the fascia. Fascial incision was made and extended transversely with the Mayo scissors. The fascia was separated from the underlying rectus tissue superiorly and inferiorly. The peritoneum was identified and entered bluntly. Peritoneal incision was extended longitudinally. The utero-vesical peritoneal reflection was incised transversely and a bladder flap was created digitally.  A low transverse hysterotomy was made.  Anterior placenta previa encountered.  Fetus in transverse position and unable to manually rotate vertex down, so breech extraction performed without difficulty. The fetus  was delivered atraumatically. The umbilical cord was clamped x2 and cut and the infant was handed to the awaiting pediatricians. The placenta was removed intact and appeared normal with a 3-vessel cord.  The uterus was exteriorized and cleared of all clot and debris. The hysterotomy was closed with running sutures of 0 Vicryl suture. A second imbricating layer was placed with the same suture. Excellent hemostasis was observed.   The left Fallopian tube was identified, grasped with the Babcock clamps, lifted to the skin incision and followed out distally to the fimbriae. An avascular midsection of the tube approximately 3-4cm from the cornua was grasped with the babcock clamps and brought into a knuckle at the skin incision. The tube was double ligated with 2-0 Vicryl suture and the intervening portion of tube was transected and removed. Excellent hemostasis was noted and the tube was returned to the abdomen. Attention was then turned to the right fallopian tube after confirmation of identification by tracing the tube out to the fimbriae. The same procedure was then performed on the right Fallopian tube. Again, excellent hemostasis was noted at the end of the procedure.  The uterus was returned to the abdomen. The pelvis was irrigated and again, excellent hemostasis was noted.   Interceed placed over incision. The On Q Pain pump System was then placed.  Trocars were placed through the abdominal wall into the subfascial space and these were used to thread the silver soaker cathaters into place.The rectus fascia was then reapproximated with running sutures of Maxon, with careful placement not to incorporate the cathaters. Subcutaneous tissues are then irrigated with saline and hemostasis assured. Subcutaneous layer placed.  Skin is then closed with 4-0 vicryl suture in a subcuticular fashion followed by skin adhesive. The cathaters are flushed each with 5 mL of Bupivicaine and stabilized into place with  dressing. Instrument, sponge, and needle counts were correct prior to the abdominal  closure and at the conclusion of the case.  The patient tolerated the procedure well and was transferred to the recovery room in stable condition.   Annamarie MajorPaul Joann Kulpa, MD, [facility] 05/18/2018  10:34 AM

## 2018-05-18 NOTE — Interval H&P Note (Signed)
History and Physical Interval Note:  05/18/2018 7:24 AM  Patricia Vazquez  has presented today for surgery, with the diagnosis of PLACENTA PREVIA, STERILITY  The various methods of treatment have been discussed with the patient and family. After consideration of risks, benefits and other options for treatment, the patient has consented to  Procedure(s): CESAREAN SECTION WITH BILATERAL TUBAL LIGATION (N/A) as a surgical intervention .  The patient's history has been reviewed, patient examined, no change in status, stable for surgery.  I have reviewed the patient's chart and labs.  Questions were answered to the patient's satisfaction.     Letitia Libraobert Paul Muriel Hannold

## 2018-05-18 NOTE — Anesthesia Procedure Notes (Addendum)
Spinal  Patient location during procedure: OB Start time: 05/18/2018 9:00 AM End time: 05/18/2018 9:13 AM Staffing Anesthesiologist: Piscitello, Precious Haws, MD Resident/CRNA: Allean Found, CRNA Other anesthesia staff: Corky Crafts, RN Performed: anesthesiologist, resident/CRNA and other anesthesia staff  Preanesthetic Checklist Completed: patient identified, site marked, surgical consent, pre-op evaluation, timeout performed, IV checked, risks and benefits discussed and monitors and equipment checked Spinal Block Patient position: sitting Prep: ChloraPrep Patient monitoring: blood pressure and continuous pulse ox Approach: midline Location: L2-3 Injection technique: single-shot Needle Needle type: Pencil-Tip  Needle gauge: 22 G Needle length: 9 cm Assessment Sensory level: T4 Additional Notes Final attempt by MDA  Negative paresthesia. Negative blood return. Positive free-flowing CSF. Expiration date of kit checked and confirmed. Patient tolerated procedure well, without complications.

## 2018-05-19 ENCOUNTER — Other Ambulatory Visit: Payer: Medicaid Other

## 2018-05-19 ENCOUNTER — Encounter: Payer: Medicaid Other | Admitting: Maternal Newborn

## 2018-05-19 LAB — CBC
HEMATOCRIT: 28.9 % — AB (ref 35.0–47.0)
Hemoglobin: 10.2 g/dL — ABNORMAL LOW (ref 12.0–16.0)
MCH: 34 pg (ref 26.0–34.0)
MCHC: 35.4 g/dL (ref 32.0–36.0)
MCV: 95.9 fL (ref 80.0–100.0)
PLATELETS: 184 10*3/uL (ref 150–440)
RBC: 3.01 MIL/uL — ABNORMAL LOW (ref 3.80–5.20)
RDW: 14.8 % — AB (ref 11.5–14.5)
WBC: 10.2 10*3/uL (ref 3.6–11.0)

## 2018-05-19 LAB — SURGICAL PATHOLOGY

## 2018-05-19 MED ORDER — IBUPROFEN 600 MG PO TABS
600.0000 mg | ORAL_TABLET | Freq: Four times a day (QID) | ORAL | Status: DC | PRN
  Administered 2018-05-19 – 2018-05-20 (×4): 600 mg via ORAL
  Filled 2018-05-19 (×4): qty 1

## 2018-05-19 NOTE — Anesthesia Post-op Follow-up Note (Signed)
  Anesthesia Pain Follow-up Note  Patient: Patricia Vazquez  Day #: 1  Date of Follow-up: 05/19/2018 Time: 9:10 AM  Last Vitals:  Vitals:   05/19/18 0347 05/19/18 0830  BP: 113/76 104/68  Pulse: 90 97  Resp: 18 17  Temp: 36.8 C (!) 36.4 C  SpO2: 95% 98%    Level of Consciousness: alert  Pain: mild   Side Effects:None  Catheter Site Exam:clean     Plan: D/C from anesthesia care at surgeon's request  Cleda MccreedyJoseph K Demisha Nokes

## 2018-05-19 NOTE — Anesthesia Postprocedure Evaluation (Signed)
Anesthesia Post Note  Patient: Patricia Vazquez  Procedure(s) Performed: CESAREAN SECTION WITH BILATERAL TUBAL LIGATION (N/A )  Patient location during evaluation: Mother Baby Anesthesia Type: Spinal Level of consciousness: oriented and awake and alert Pain management: pain level controlled Vital Signs Assessment: post-procedure vital signs reviewed and stable Respiratory status: spontaneous breathing Cardiovascular status: blood pressure returned to baseline and stable Postop Assessment: no headache, no backache, no apparent nausea or vomiting and patient able to bend at knees Anesthetic complications: no     Last Vitals:  Vitals:   05/19/18 0347 05/19/18 0830  BP: 113/76 104/68  Pulse: 90 97  Resp: 18 17  Temp: 36.8 C (!) 36.4 C  SpO2: 95% 98%    Last Pain:  Vitals:   05/19/18 0830  TempSrc: Oral  PainSc: 4                  Cleda MccreedyJoseph K Piscitello

## 2018-05-19 NOTE — Progress Notes (Signed)
  Subjective:   Doing well post op day 1: She is ambulating and voiding without difficulty. She is tolerating PO intake and her pain is well controlled with PO medication. She is sitting on side of bed and waiting for her IV to be discontinued so she can get up to shower.   Objective:  Blood pressure 104/68, pulse 97, temperature (!) 97.5 F (36.4 C), temperature source Oral, resp. rate 17, height 5\' 9"  (1.753 m), weight (!) 146.1 kg, last menstrual period 08/21/2017, SpO2 98 %  General: NAD Pulmonary: no increased work of breathing Abdomen: non-distended, non-tender, fundus firm at level of umbilicus Incision: honeycomb dressing is C/D/I, On Q pump is intact Extremities: no edema, no erythema, no tenderness  Results for orders placed or performed during the hospital encounter of 05/18/18 (from the past 24 hour(s))  Glucose, capillary     Status: None   Collection Time: 05/18/18  1:06 PM  Result Value Ref Range   Glucose-Capillary 92 70 - 99 mg/dL  CBC     Status: Abnormal   Collection Time: 05/19/18  5:09 AM  Result Value Ref Range   WBC 10.2 3.6 - 11.0 K/uL   RBC 3.01 (L) 3.80 - 5.20 MIL/uL   Hemoglobin 10.2 (L) 12.0 - 16.0 g/dL   HCT 78.428.9 (L) 69.635.0 - 29.547.0 %   MCV 95.9 80.0 - 100.0 fL   MCH 34.0 26.0 - 34.0 pg   MCHC 35.4 32.0 - 36.0 g/dL   RDW 28.414.8 (H) 13.211.5 - 44.014.5 %   Platelets 184 150 - 440 K/uL    Intake/Output Summary (Last 24 hours) at 05/19/2018 1029 Last data filed at 05/19/2018 1021 Gross per 24 hour  Intake 4074.4 ml  Output 793 ml  Net 3281.4 ml      Assessment:   32 y.o. [id number] postoperativeday # 1   Plan:  1) Acute blood loss anemia - hemodynamically stable and asymptomatic - po ferrous sulfate  2) O positive, Rubella Immune, Varicella Immune  3) TDAP status: given antepartum   4) Formula/Contraception: bilateral tubal ligation  5) Disposition: continue routine post c/section care   Tresea MallJane Breckin Zafar, CNM

## 2018-05-20 MED ORDER — DOCUSATE SODIUM 100 MG PO CAPS: 100.0000 mg | ORAL_CAPSULE | Freq: Every day | ORAL | 2 refills | Status: DC | PRN

## 2018-05-20 MED ORDER — IBUPROFEN 600 MG PO TABS: 600.0000 mg | ORAL_TABLET | Freq: Four times a day (QID) | ORAL | 1 refills | Status: DC | PRN

## 2018-05-20 MED ORDER — OXYCODONE-ACETAMINOPHEN 5-325 MG PO TABS: 1.0000 | ORAL_TABLET | Freq: Four times a day (QID) | ORAL | 0 refills | Status: AC | PRN

## 2018-05-20 NOTE — Discharge Instructions (Signed)
Please call your doctor or return to the ER if you experience any chest pains, shortness of breath, dizziness, visual changes, fever greater than 101, any heavy bleeding (saturating more than 1 pad per hour), large clots, or foul smelling discharge, any worsening abdominal pain and cramping that is not controlled by pain medication, or any signs of postpartum depression. No tampons, enemas, douches, or sexual intercourse for 6 weeks. Also avoid tub baths, hot tubs, or swimming for 6 weeks.  ° ° °Check your incision daily for any signs of infection such as redness, warmth, swelling, increased pain, or pus/foul smelling drainage ° ° °Activity: do not lift over 10 lbs for 6 weeks  °No driving for 2 weeks  °Pelvic rest for 6 weeks  °

## 2018-05-20 NOTE — Progress Notes (Signed)
Discharge order received from doctor. Reviewed discharge instructions and prescriptions with patient and answered all questions. Incision cleaning kit given. Follow up appointment given. Patient verbalized understanding. ID bands checked. Patient discharged home with infant via wheelchair by nursing/auxillary.    Joseph Johns Garner, RN 

## 2018-05-20 NOTE — Progress Notes (Signed)
Admit Date: 05/18/2018 Today's Date: 05/20/2018  Subjective: Postpartum Day 2: Cesarean Delivery Patient reports tolerating PO, + flatus and no problems voiding.    Objective: Vital signs in last 24 hours: Temp:  [98.1 F (36.7 C)-98.4 F (36.9 C)] 98.1 F (36.7 C) (09/14 0728) Pulse Rate:  [96-105] 96 (09/14 0728) Resp:  [15-19] 19 (09/14 0728) BP: (98-127)/(67-90) 127/90 (09/14 0728) SpO2:  [100 %] 100 % (09/14 0728)  Physical Exam:  General: alert, cooperative and no distress Lochia: appropriate Uterine Fundus: firm Incision: healing well, no significant drainage, no dehiscence, no significant erythema DVT Evaluation: No evidence of DVT seen on physical exam. No cords or calf tenderness.  Recent Labs    05/19/18 0509  HGB 10.2*  HCT 28.9*    Assessment/Plan: Status post Cesarean section. Doing well postoperatively.  Discharge home with standard precautions and return to clinic in 2 weeks. Patricia Vazquez would like early discharge.  She is bottle feeding and doing well.  Rubella and Varicella immune.  BTL for contraception.    Patricia Vazquez 05/20/2018, 11:44 AM

## 2018-05-22 ENCOUNTER — Other Ambulatory Visit: Payer: Medicaid Other

## 2018-05-30 ENCOUNTER — Encounter: Payer: Self-pay | Admitting: Obstetrics & Gynecology

## 2018-05-30 ENCOUNTER — Ambulatory Visit (INDEPENDENT_AMBULATORY_CARE_PROVIDER_SITE_OTHER): Payer: Medicaid Other | Admitting: Obstetrics & Gynecology

## 2018-05-30 DIAGNOSIS — Z23 Encounter for immunization: Secondary | ICD-10-CM | POA: Diagnosis not present

## 2018-05-30 NOTE — Progress Notes (Signed)
  Postoperative Follow-up Patient presents post op from CS BTL for placenta previa, repeat, sterility, preterm, 1 week ago.  Subjective: Patient reports some improvement in her preop symptoms. Eating a regular diet without difficulty. Pain is controlled with current analgesics. Medications being used: ibuprofen (OTC) and narcotic analgesics including Percocet.  Activity: sedentary. Patient reports additional symptom's since surgery of min lochia  Objective: BP 120/76   Pulse 72   Ht 5\' 8"  (1.727 m)   Wt (!) 304 lb (137.9 kg)   Breastfeeding? No   BMI 46.22 kg/m  Physical Exam  Constitutional: She is oriented to person, place, and time. She appears well-developed and well-nourished. No distress.  Cardiovascular: Normal rate.  Pulmonary/Chest: Effort normal.  Abdominal: Soft. She exhibits no distension. There is no tenderness.  Incision Healing Well   Musculoskeletal: Normal range of motion.  Neurological: She is alert and oriented to person, place, and time. No cranial nerve deficit.  Skin: Skin is warm and dry.  Psychiatric: She has a normal mood and affect.   Assessment: s/p :  cesarean section and tubal ligation progressing well  Plan: Patient has done well after surgery with no apparent complications.  I have discussed the post-operative course to date, and the expected progress moving forward.  The patient understands what complications to be concerned about.  I will see the patient in routine follow up, or sooner if needed.    Activity plan: No heavy lifting. Pelvic rest  Flu shot today  Patricia Vazquez 05/30/2018, 10:08 AM

## 2018-06-21 ENCOUNTER — Telehealth: Payer: Self-pay

## 2018-06-21 NOTE — Telephone Encounter (Signed)
FMLA/DISABILTY form for [employer] filled out, signature obtained and given to [place] for processing.

## 2018-06-27 ENCOUNTER — Ambulatory Visit: Payer: Medicaid Other | Admitting: Obstetrics & Gynecology

## 2018-07-11 ENCOUNTER — Ambulatory Visit: Payer: Medicaid Other | Admitting: Obstetrics & Gynecology

## 2018-07-21 ENCOUNTER — Ambulatory Visit (INDEPENDENT_AMBULATORY_CARE_PROVIDER_SITE_OTHER): Payer: Medicaid Other | Admitting: Obstetrics & Gynecology

## 2018-07-21 ENCOUNTER — Telehealth: Payer: Self-pay | Admitting: Obstetrics & Gynecology

## 2018-07-21 ENCOUNTER — Encounter: Payer: Self-pay | Admitting: Obstetrics & Gynecology

## 2018-07-21 NOTE — Patient Instructions (Signed)

## 2018-07-21 NOTE — Telephone Encounter (Signed)
[facility]

## 2018-07-21 NOTE — Progress Notes (Signed)
  OBSTETRICS POSTPARTUM CLINIC PROGRESS NOTE  Subjective:     Patricia Vazquez is a 32 y.o. [phone] female who presents for a postpartum visit. She is 6 weeks postpartum following a Preterm pregnancy <37 weeks due to previa and delivery by C-section repeat; no problems after deliver.  I have fully reviewed the prenatal and intrapartum course. Anesthesia: spinal.  Postpartum course has been complicated by uncomplicated.  Baby is feeding by Bottle.  Bleeding: patient has  resumed menses.  Bowel function is normal. Bladder function is normal.  Patient is not sexually active. Contraception method desired is tubal ligation.  Postpartum depression screening: negative. Edinburgh 0.  The following portions of the patient's history were reviewed and updated as appropriate: allergies, current medications, past family history, past medical history, past social history, past surgical history and problem list.  Review of Systems Pertinent items are noted in HPI.  Objective:    BP 98/60   Pulse 82   Ht 5\' 8"  (1.727 m)   Wt 296 lb (134.3 kg)   LMP 07/04/2018 (Approximate)   Breastfeeding? No   BMI 45.01 kg/m   General:  alert and no distress   Breasts:  inspection negative, no nipple discharge or bleeding, no masses or nodularity palpable  Lungs: clear to auscultation bilaterally  Heart:  regular rate and rhythm, S1, S2 normal, no murmur, click, rub or gallop  Abdomen: soft, non-tender; bowel sounds normal; no masses,  no organomegaly.  Well healed Pfannenstiel incision   Vulva:  normal  Vagina: normal vagina, no discharge, exudate, lesion, or erythema  Cervix:  no cervical motion tenderness and no lesions  Corpus: normal size, contour, position, consistency, mobility, non-tender  Adnexa:  normal adnexa and no mass, fullness, tenderness  Rectal Exam: Not performed.          Assessment:  Post Partum Care visit 1. Postpartum care and examination  Plan:  See orders and Patient  Instructions Resume all normal activities Follow up in: 1 month or as needed.     PAP, consideration for MIrena due to h/o heavy cycles and first cycle after delivery was very heavy as well.  Will wait and see what next period is like first  Annamarie MajorPaul Harkirat Orozco, MD, Merlinda FrederickFACOG [facility], [facility] 07/21/2018  3:57 PM

## 2018-07-26 NOTE — Telephone Encounter (Signed)
Noted. Will order to arrive by apt date/time. 

## 2018-08-01 ENCOUNTER — Telehealth: Payer: Self-pay | Admitting: Obstetrics & Gynecology

## 2018-08-01 NOTE — Telephone Encounter (Signed)
Patient is schedule for mirena insertion with Dr. Tiburcio PeaHarris 08/31/18

## 2018-08-01 NOTE — Telephone Encounter (Signed)
Noted. Will order to arrive by apt date/time. 

## 2018-08-31 ENCOUNTER — Ambulatory Visit: Payer: Medicaid Other | Admitting: Obstetrics & Gynecology

## 2018-08-31 NOTE — Telephone Encounter (Signed)
Patient cancelled with call back to reschedule

## 2018-09-13 ENCOUNTER — Ambulatory Visit: Payer: Medicaid Other | Admitting: Obstetrics & Gynecology

## 2018-10-03 ENCOUNTER — Encounter: Payer: Self-pay | Admitting: Emergency Medicine

## 2018-10-03 ENCOUNTER — Other Ambulatory Visit: Payer: Self-pay

## 2018-10-03 ENCOUNTER — Emergency Department
Admission: EM | Admit: 2018-10-03 | Discharge: 2018-10-03 | Disposition: A | Discharge status: Home / Self Care | Payer: Self-pay | Attending: Emergency Medicine | Admitting: Emergency Medicine

## 2018-10-03 ENCOUNTER — Emergency Department: Payer: Self-pay

## 2018-10-03 DIAGNOSIS — J069 Acute upper respiratory infection, unspecified: Secondary | ICD-10-CM | POA: Insufficient documentation

## 2018-10-03 LAB — INFLUENZA PANEL BY PCR (TYPE A & B)
Influenza A By PCR: NEGATIVE
Influenza B By PCR: NEGATIVE

## 2018-10-03 MED ORDER — OXYMETAZOLINE HCL 0.05 % NA SOLN: 2.0000 | Freq: Two times a day (BID) | NASAL | 0 refills | Status: AC

## 2018-10-03 MED ORDER — PSEUDOEPH-BROMPHEN-DM 30-2-10 MG/5ML PO SYRP: 5.0000 mL | ORAL_SOLUTION | Freq: Four times a day (QID) | ORAL | 0 refills | Status: DC | PRN

## 2018-10-03 NOTE — ED Provider Notes (Signed)
[facility] Emergency Department Provider Note  ____________________________________________  Time seen: Approximately 10:02 AM  I have reviewed the triage vital signs and the nursing notes.   HISTORY  Chief Complaint Cough; Generalized Body Aches; and Nasal Congestion    HPI Patricia Vazquez is a 33 y.o. female that presents to the emergency department for evaluation of nasal congestion, cough for 3 days and body aches for 1 day. Cough is non productive. She has had some tenderness over her sinuses. She is unsure of fever. She has been nauseas but not vomited. She does not smoke.  She has several sick contacts and recently took a family member to urgent care, took her child to the doctor and she works with the public.  No SOB, CP, vomiting, diarrhea.     Past Medical History:  Diagnosis Date  . Gestational diabetes   . Obesity     Patient Active Problem List   Diagnosis Date Noted  . Allergic sinusitis 05/17/2018  . BMI 45.0-49.9, adult (HCC) 10/12/2017    Past Surgical History:  Procedure Laterality Date  . CESAREAN SECTION WITH BILATERAL TUBAL LIGATION N/A 05/18/2018   Procedure: CESAREAN SECTION WITH BILATERAL TUBAL LIGATION;  Surgeon: Nadara MustardHarris, Robert P, MD;  Location: [facility] ORS;  Service: Obstetrics;  Laterality: N/A;  . NO PAST SURGERIES      Prior to Admission medications   Medication Sig Start Date End Date Taking? Authorizing Provider  brompheniramine-pseudoephedrine-DM 30-2-10 MG/5ML syrup Take 5 mLs by mouth 4 (four) times daily as needed. 10/03/18   Enid DerryWagner, Mccoy Testa, PA-C  oxymetazoline (AFRIN) 0.05 % nasal spray Place 2 sprays into both nostrils 2 (two) times daily for 3 days. 10/03/18 10/06/18  Enid DerryWagner, Julissa Browning, PA-C    Allergies Other  Family History  Problem Relation Age of Onset  . Diabetes Maternal Grandfather     Social History Social History   Tobacco Use  . Smoking status: Never Smoker  . Smokeless tobacco: Never Used   Substance Use Topics  . Alcohol use: No    Frequency: Never  . Drug use: No     Review of Systems  Constitutional: No chills Eyes: No visual changes. No discharge. ENT: Positive for congestion and rhinorrhea. Cardiovascular: No chest pain. Respiratory: Positive for cough. No SOB. Gastrointestinal: No abdominal pain.  No nausea, no vomiting.  No diarrhea.  No constipation. Musculoskeletal: Negative for musculoskeletal pain. Skin: Negative for rash, abrasions, lacerations, ecchymosis. Neurological: Negative for headaches.   ____________________________________________   PHYSICAL EXAM:  VITAL SIGNS: ED Triage Vitals  Enc Vitals Group     BP 10/03/18 0820 121/78     Pulse Rate 10/03/18 0820 100     Resp 10/03/18 0820 18     Temp 10/03/18 0820 98 F (36.7 C)     Temp Source 10/03/18 0820 Oral     SpO2 10/03/18 0820 98 %     Weight 10/03/18 0821 268 lb (121.6 kg)     Height 10/03/18 0821 5\' 8"  (1.727 m)     Head Circumference --      Peak Flow --      Pain Score 10/03/18 0820 6     Pain Loc --      Pain Edu? --      Excl. in GC? --      Constitutional: Alert and oriented. Well appearing and in no acute distress. Eyes: Conjunctivae are normal. PERRL. EOMI. No discharge. Head: Atraumatic. ENT: No frontal and maxillary sinus tenderness.  Ears: Tympanic membranes pearly gray with good landmarks. No discharge.      Nose: Mild congestion/rhinnorhea.      Mouth/Throat: Mucous membranes are moist. Oropharynx non-erythematous. Tonsils not enlarged. No exudates. Uvula midline. Neck: No stridor.   Hematological/Lymphatic/Immunilogical: No cervical lymphadenopathy. Cardiovascular: Normal rate, regular rhythm.  Good peripheral circulation. Respiratory: Normal respiratory effort without tachypnea or retractions. Lungs CTAB. Good air entry to the bases with no decreased or absent breath sounds. Gastrointestinal: Bowel sounds 4 quadrants. Soft and nontender to palpation. No  guarding or rigidity. No palpable masses. No distention. Musculoskeletal: Full range of motion to all extremities. No gross deformities appreciated. Neurologic:  Normal speech and language. No gross focal neurologic deficits are appreciated.  Skin:  Skin is warm, dry and intact. No rash noted. Psychiatric: Mood and affect are normal. Speech and behavior are normal. Patient exhibits appropriate insight and judgement.   ____________________________________________   LABS (all labs ordered are listed, but only abnormal results are displayed)  Labs Reviewed  INFLUENZA PANEL BY PCR (TYPE A & B)   ____________________________________________  EKG   ____________________________________________  RADIOLOGY Lexine Baton, personally viewed and evaluated these images (plain radiographs) as part of my medical decision making, as well as reviewing the written report by the radiologist.  Dg Chest 2 View  Result Date: 10/03/2018 CLINICAL DATA:  Cough and congestion for 2 days EXAM: CHEST - 2 VIEW COMPARISON:  None. FINDINGS: The heart size and mediastinal contours are within normal limits. Both lungs are clear. The visualized skeletal structures are unremarkable. IMPRESSION: No active cardiopulmonary disease. Electronically Signed   By: Alcide Clever M.D.   On: 10/03/2018 10:07    ____________________________________________    PROCEDURES  Procedure(s) performed:    Procedures    Medications - No data to display   ____________________________________________   INITIAL IMPRESSION / ASSESSMENT AND PLAN / ED COURSE  Pertinent labs & imaging results that were available during my care of the patient were reviewed by me and considered in my medical decision making (see chart for details).  Review of the [place] CSRS was performed in accordance of the [facility] prior to dispensing any controlled drugs.   Patient's diagnosis is consistent with viral URI. Vital signs and exam are reassuring.  Influenza test is negative. Patient appears well and is staying well hydrated. Patient should alternate tylenol and ibuprofen for fever. Patient feels comfortable going home. Patient will be discharged home with prescriptions for bromfed and afrin. Patient is to follow up with PCP as needed or otherwise directed. Patient is given ED precautions to return to the ED for any worsening or new symptoms.   ____________________________________________  FINAL CLINICAL IMPRESSION(S) / ED DIAGNOSES  Final diagnoses:  Viral upper respiratory tract infection      NEW MEDICATIONS STARTED DURING THIS VISIT:  ED Discharge Orders         Ordered    brompheniramine-pseudoephedrine-DM 30-2-10 MG/5ML syrup  4 times daily PRN     10/03/18 1039    oxymetazoline (AFRIN) 0.05 % nasal spray  2 times daily     10/03/18 1039              This chart was dictated using voice recognition software/Dragon. Despite best efforts to proofread, errors can occur which can change the meaning. Any change was purely unintentional.    Enid Derry, PA-C 10/03/18 1121    Dionne Bucy, MD 10/03/18 [phone]

## 2018-10-03 NOTE — ED Triage Notes (Signed)
Pt states cough, nasal congestion for a few days, today, generalized body aches, appears in NAD.

## 2018-10-03 NOTE — ED Notes (Signed)
See triage note  Presents with some sinus pressure for couple of days  Then this am woke up with body aches  Denies any fever

## 2018-10-10 ENCOUNTER — Ambulatory Visit: Payer: Medicaid Other | Admitting: Obstetrics & Gynecology

## 2018-11-20 IMAGING — US US OB LIMITED
1 series · 14 of 28 positions shown · non-contrast
Comparison: none

CLINICAL DATA: Vaginal bleeding for 45 minutes.

EXAM:
LIMITED OBSTETRIC ULTRASOUND

[Series 1: us ob limited · 14 of 30 slices shown]
[im 2/30]
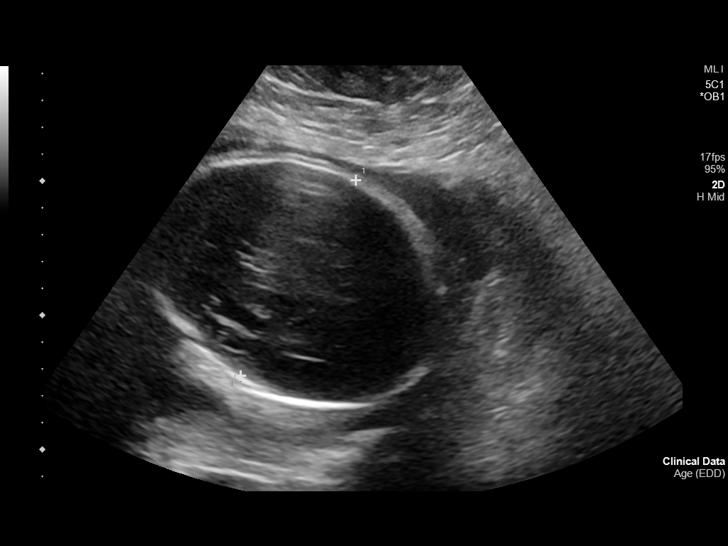
[im 4/30]
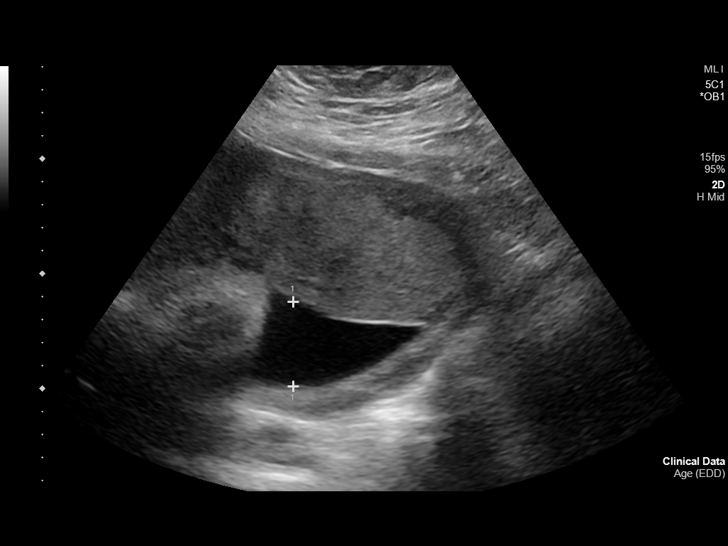
[im 6/30]
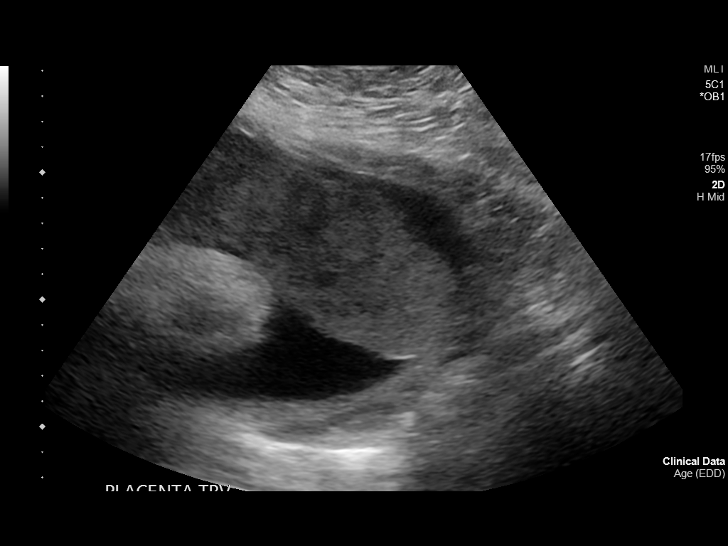
[im 8/30]
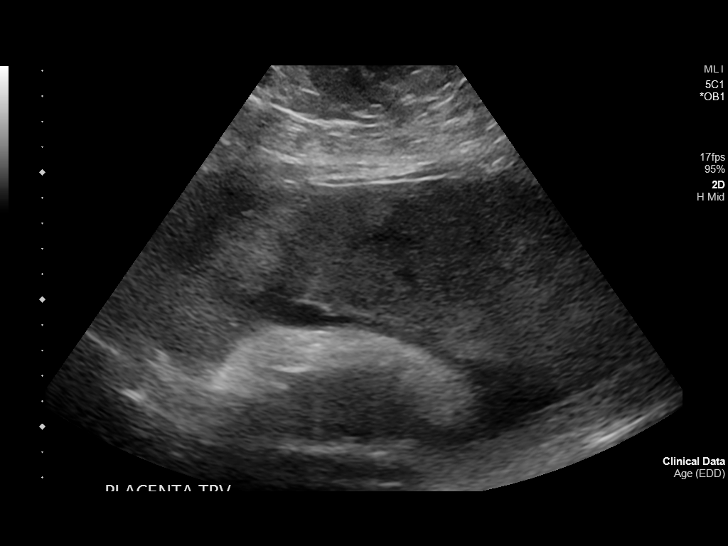
[im 10/30]
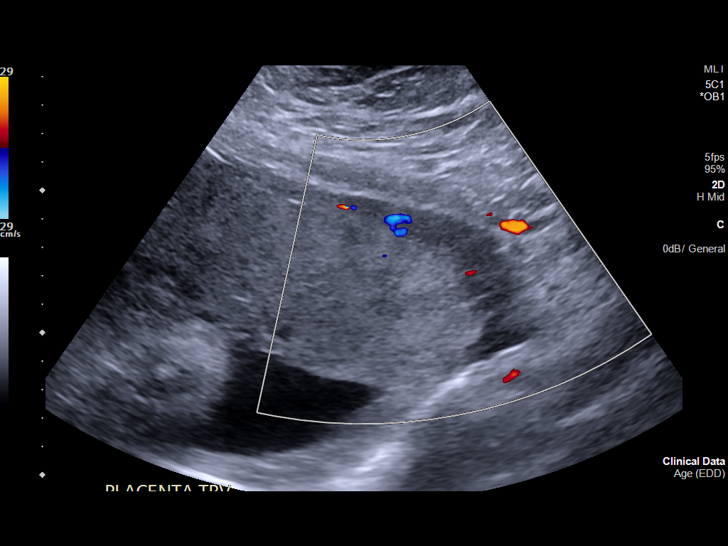
[im 12/30]
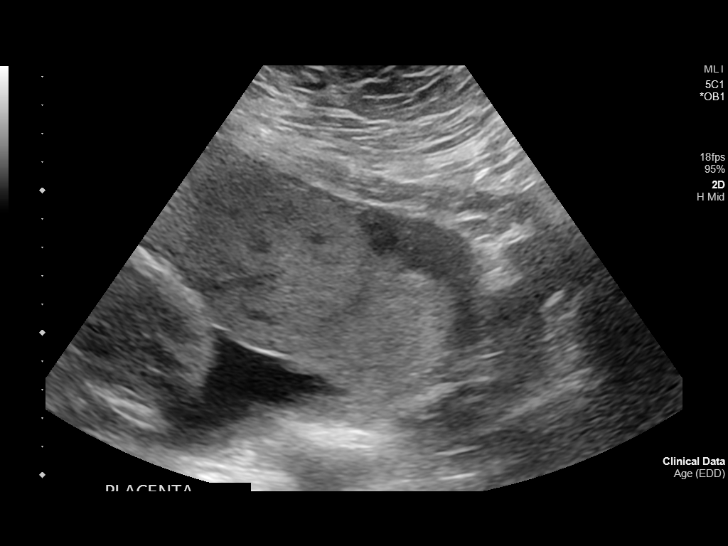
[im 14/30]
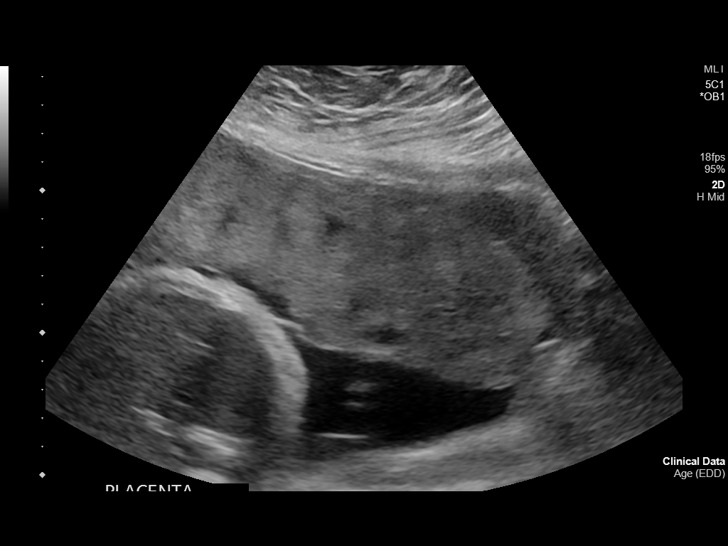
[im 17/30]
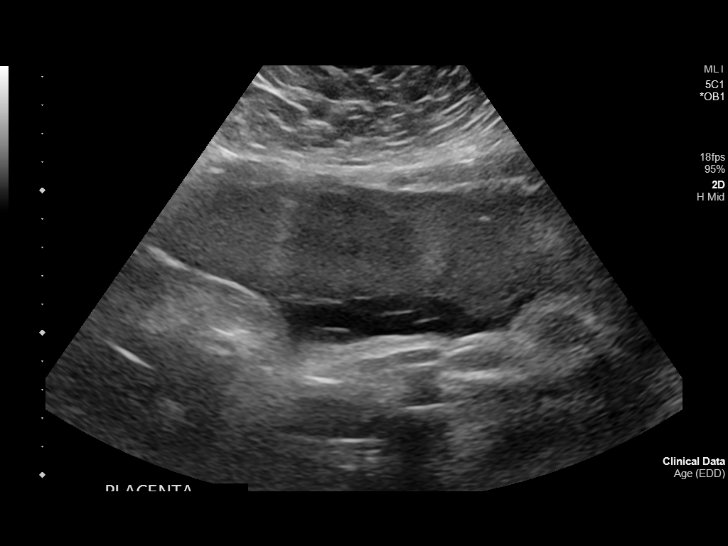
[im 19/30]
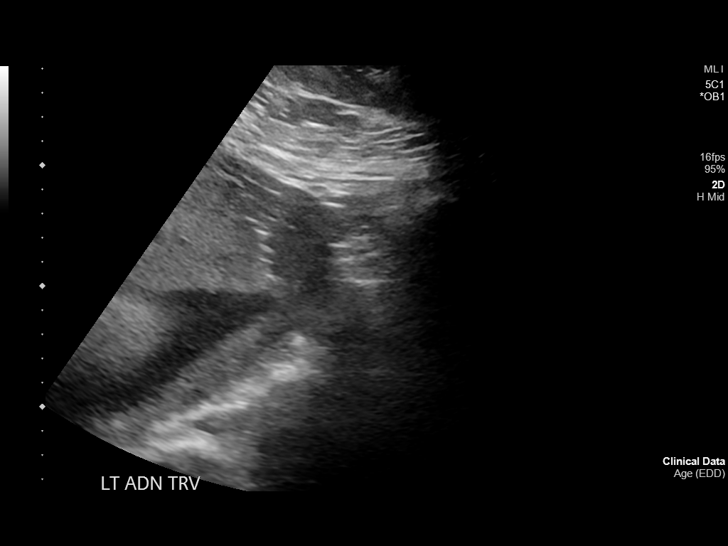
[im 21/30]
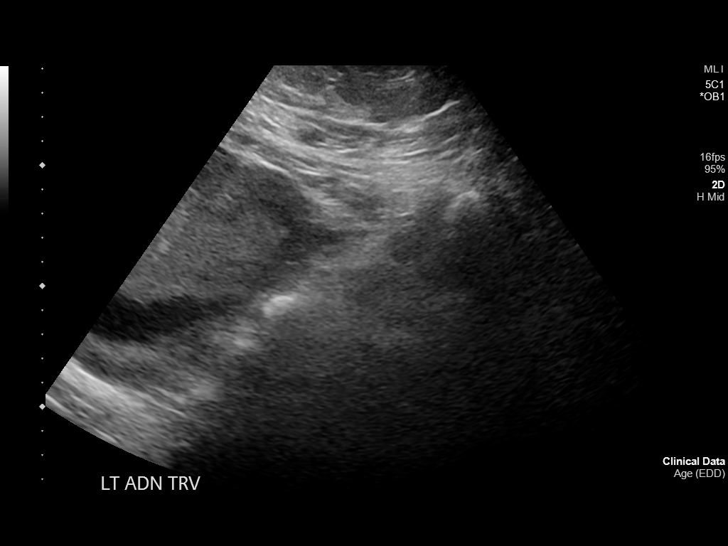
[im 23/30]
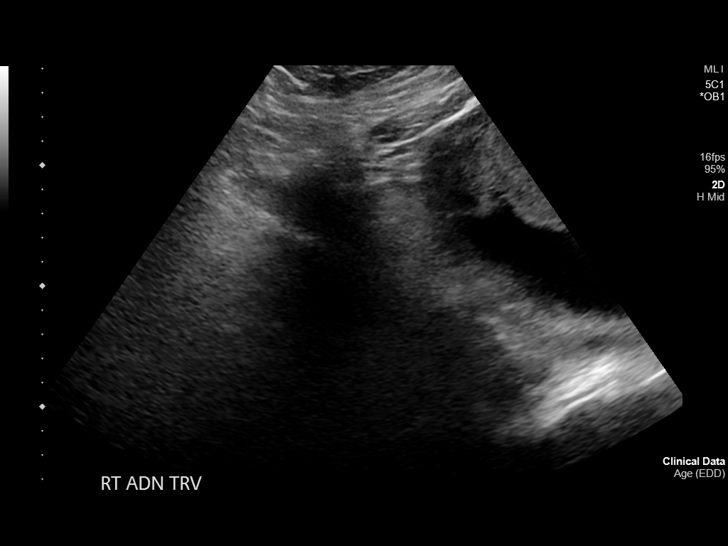
[im 25/30]
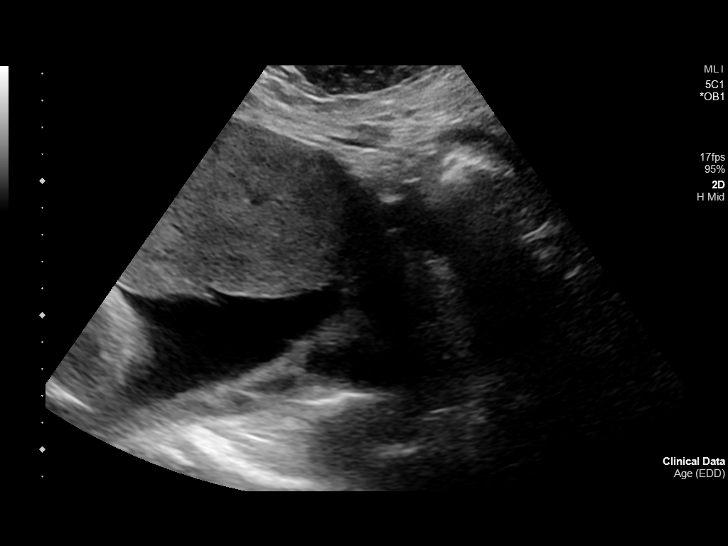
[im 27/30]
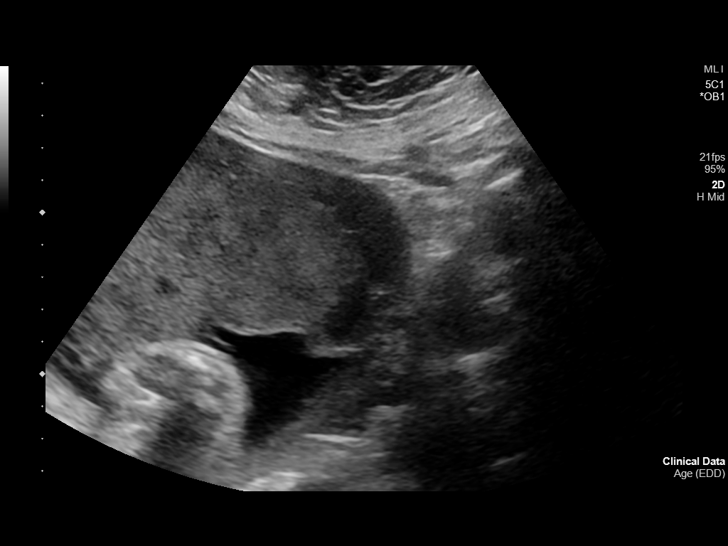
[im 30/30]
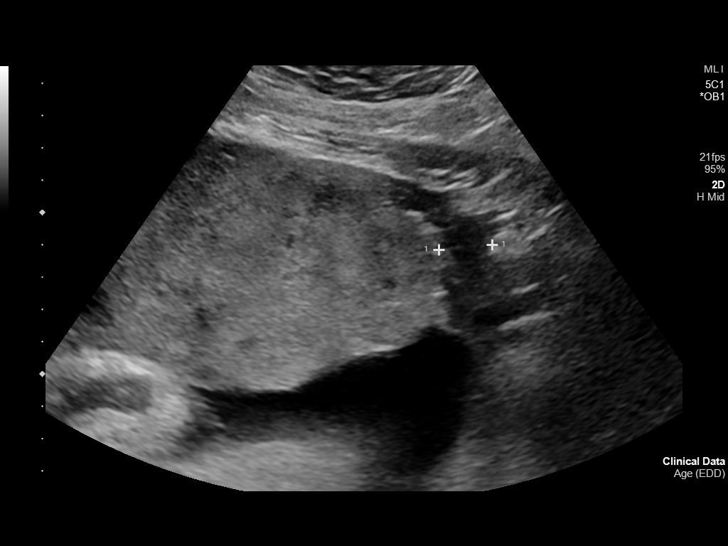

[14 of 28 positions shown; findings below may reference images not displayed]

FINDINGS: Number of Fetuses: 1

Heart Rate:  133 bpm

Movement: Yes

Presentation: Transverse head maternal RIGHT.

Placental Location: Anterior. Hypoechoic area along the inferior
margin of the placenta, measuring 5.5 x 1.7 x 2 cm, possible bleed.

Previa: Marginal

Amniotic Fluid (Subjective):  Within normal limits.

BPD: 8.43 cm 34 w  0 d

MATERNAL FINDINGS:

Cervix:  Appears closed.

Uterus/Adnexae: No abnormality visualized.
IMPRESSION: 1. Single live intrauterine pregnancy with estimated gestational age
of 34 weeks and 0 days by BPD measurement.
2. Hypoechoic collection along the inferior margin of the placenta,
measuring 5.5 x 1.7 x 2 cm, nonspecific appearance, could represent
small marginal abruption or merely a prominent venous lake.
3. Marginal placenta previa. Inferior margin of the placenta is
approximately 1.2 cm from the internal cervical os.

This exam is performed on an emergent basis and does not
comprehensively evaluate fetal size, dating, or anatomy; follow-up
complete OB US should be considered if further fetal assessment is
warranted.

## 2019-01-24 ENCOUNTER — Ambulatory Visit: Payer: Self-pay

## 2019-01-24 ENCOUNTER — Encounter: Payer: Self-pay | Admitting: Emergency Medicine

## 2019-01-24 ENCOUNTER — Other Ambulatory Visit: Payer: Self-pay

## 2019-01-24 ENCOUNTER — Ambulatory Visit
Admission: EM | Admit: 2019-01-24 | Discharge: 2019-01-24 | Disposition: A | Discharge status: Home / Self Care | Payer: Self-pay | Attending: Emergency Medicine | Admitting: Emergency Medicine

## 2019-01-24 DIAGNOSIS — M778 Other enthesopathies, not elsewhere classified: Secondary | ICD-10-CM

## 2019-01-24 DIAGNOSIS — M25512 Pain in left shoulder: Secondary | ICD-10-CM | POA: Insufficient documentation

## 2019-01-24 DIAGNOSIS — M7582 Other shoulder lesions, left shoulder: Secondary | ICD-10-CM | POA: Insufficient documentation

## 2019-01-24 MED ORDER — MELOXICAM 15 MG PO TABS: 15.0000 mg | ORAL_TABLET | Freq: Every day | ORAL | 0 refills | Status: AC | PRN

## 2019-01-24 NOTE — Discharge Instructions (Addendum)
Take medication as prescribed. Rest. Drink plenty of fluids. Ice. Stretch.   Follow up with orthopedic as needed for continued pain.   Follow up with your primary care physician this week as needed. Return to Urgent care for new or worsening concerns.

## 2019-01-24 NOTE — ED Triage Notes (Signed)
Patient c/o left shoulder pain that started 1-2 months ago. She states for the last 2 weeks the pain has been consistent. Reports decreased ROM. Denies injury.

## 2019-01-24 NOTE — ED Provider Notes (Signed)
[facility] ____________________________________________  Time seen: Approximately 3:33 PM  I have reviewed the triage vital signs and the nursing notes.   HISTORY  Chief Complaint Shoulder Pain  HPI Patricia Vazquez is a 33 y.o. female presenting for evaluation of left shoulder pain present for the last 1.5 to 2 months.  Patient reports pain has been more mild, but states has increased over the last few weeks.  States pain is minimal to none if sitting upright and still.  States pain is worse with direct palpation and movement.  States some pain is catching.  Denies pain radiation, paresthesias, fall or direct injury.  Denies rash or skin changes.  Reports left arm otherwise feels normal.  Denies chest pain, shortness of breath, fevers or recent sickness.  Reports left hand dominant.  Does report a lot of repetitive arm movements while at work.  Has occasionally taken Tylenol and ibuprofen without resolution.  States painful to sleep on her arm.  Reports otherwise doing well.  No history of the same.  Patient's last menstrual period was 01/09/2019.  Denies pregnancy  Past Medical History:  Diagnosis Date  . Gestational diabetes   . Obesity     Patient Active Problem List   Diagnosis Date Noted  . Allergic sinusitis 05/17/2018  . BMI 45.0-49.9, adult ([facility]) 10/12/2017    Past Surgical History:  Procedure Laterality Date  . CESAREAN SECTION WITH BILATERAL TUBAL LIGATION N/A 05/18/2018   Procedure: CESAREAN SECTION WITH BILATERAL TUBAL LIGATION;  Surgeon: Nadara Mustard, MD;  Location: [facility] ORS;  Service: Obstetrics;  Laterality: N/A;  . NO PAST SURGERIES       No current facility-administered medications for this encounter.   Current Outpatient Medications:  .  meloxicam (MOBIC) 15 MG tablet, Take 1 tablet (15 mg total) by mouth daily as needed., Disp: 14 tablet, Rfl: 0  Allergies Other  Family History  Problem Relation Age of Onset  . Diabetes Maternal  Grandfather     Social History Social History   Tobacco Use  . Smoking status: Never Smoker  . Smokeless tobacco: Never Used  Substance Use Topics  . Alcohol use: No    Frequency: Never  . Drug use: No    Review of Systems Constitutional: No fever Cardiovascular: Denies chest pain. Respiratory: Denies shortness of breath. Gastrointestinal: No abdominal pain.   Musculoskeletal: Positive left shoulder pain.  Skin: Negative for rash.   ____________________________________________   PHYSICAL EXAM:  VITAL SIGNS: ED Triage Vitals  Enc Vitals Group     BP 01/24/19 1447 (!) 126/100     Pulse Rate 01/24/19 1447 97     Resp 01/24/19 1447 18     Temp 01/24/19 1447 98.8 F (37.1 C)     Temp Source 01/24/19 1447 Oral     SpO2 01/24/19 1447 100 %     Weight 01/24/19 1448 (!) 320 lb (145.2 kg)     Height 01/24/19 1448 5\' 8"  (1.727 m)     Head Circumference --      Peak Flow --      Pain Score 01/24/19 1448 8     Pain Loc --      Pain Edu? --      Excl. in GC? --    Vitals:   01/24/19 1447 01/24/19 1448 01/24/19 1547  BP: (!) 126/100  113/86  Pulse: 97    Resp: 18    Temp: 98.8 F (37.1 C)    TempSrc: Oral  SpO2: 100%    Weight:  (!) 320 lb (145.2 kg)   Height:  5\' 8"  (1.727 m)      Constitutional: Alert and oriented. Well appearing and in no acute distress. ENT      Head: Normocephalic and atraumatic. Cardiovascular: Normal rate, regular rhythm. Grossly normal heart sounds.  Good peripheral circulation. Respiratory: Normal respiratory effort without tachypnea nor retractions. Breath sounds are clear and equal bilaterally. No wheezes, rales, rhonchi. Musculoskeletal:  No midline cervical, thoracic or lumbar tenderness to palpation. Bilateral distal radial pulses equal and easily palpated. Except[facility] proximal [facility]erus anteriorly tenderness to direct palpation, no ecchymosis, no erythema, pain with lateral abduction, positive  empty can test, negative drop arm test, full range of motion present, left upper extremity otherwise nontender and with normal distal sensation. Neurologic:  Normal speech and language. Speech is normal. No gait instability.  Skin:  Skin is warm, dry and intact. No rash noted. Psychiatric: Mood and affect are normal. Speech and behavior are normal. Patient exhibits appropriate insight and judgment   ___________________________________________   LABS (all labs ordered are listed, but only abnormal results are displayed)  Labs Reviewed - No data to display  RADIOLOGY  Dg Shoulder Left  Result Date: 01/24/2019 CLINICAL DATA:  Pain x2 months EXAM: LEFT SHOULDER - 2+ VIEW COMPARISON:  None. FINDINGS: Mild degenerative changes are noted of the acromioclavicular joint. There is no acute displaced fracture or dislocation. There are mild degenerative changes of the glenohumeral joint. IMPRESSION: No acute osseous abnormality. Electronically Signed   By: Katherine Mantlehristopher  Green M.D.   On: 01/24/2019 15:19   ____________________________________________   PROCEDURES Procedures    INITIAL IMPRESSION / ASSESSMENT AND PLAN / ED COURSE  Pertinent labs & imaging results that were available during my care of the patient were reviewed by me and considered in my medical decision making (see chart for details).  Well-appearing patient.  No acute distress.  Left shoulder pain present for the last 2 months.  Left shoulder x-ray as above, no acute osseous abnormality, mild degenerative changes.  Suspect tendinitis.  Discussed use of Mobic, ice, rest, shoulder exercises including pendulum exercises.  Supportive care.  Follow-up with orthopedic for continued pain.Discussed indication, risks and benefits of medications with patient.  Discussed follow up with Primary care physician this week as needed. Discussed follow up and return parameters including no resolution or any worsening concerns. Patient verbalized  understanding and agreed to plan.   ____________________________________________   FINAL CLINICAL IMPRESSION(S) / ED DIAGNOSES  Final diagnoses:  Acute pain of left shoulder  Left shoulder tendinitis     ED Discharge Orders         Ordered    meloxicam (MOBIC) 15 MG tablet  Daily PRN     01/24/19 1534           Note: This dictation was prepared with Dragon dictation along with smaller phrase technology. Any transcriptional errors that result from this process are unintentional.         Renford DillsMiller, Trenisha Lafavor, NP 01/24/19 [phone]

## 2019-10-24 NOTE — Telephone Encounter (Signed)
09/13/2018 apt cancelled pt NO SHOW 10/10/2018. No Device received

## 2022-05-24 ENCOUNTER — Ambulatory Visit
Admission: EM | Admit: 2022-05-24 | Discharge: 2022-05-24 | Disposition: A | Discharge status: Home / Self Care | Payer: Medicaid Other | Attending: Emergency Medicine | Admitting: Emergency Medicine

## 2022-05-24 DIAGNOSIS — J069 Acute upper respiratory infection, unspecified: Secondary | ICD-10-CM

## 2022-05-24 MED ORDER — IPRATROPIUM BROMIDE 0.06 % NA SOLN: 2.0000 | Freq: Four times a day (QID) | NASAL | 12 refills | Status: DC

## 2022-05-24 MED ORDER — BENZONATATE 100 MG PO CAPS: 200.0000 mg | ORAL_CAPSULE | Freq: Three times a day (TID) | ORAL | 0 refills | Status: DC

## 2022-05-24 MED ORDER — PROMETHAZINE-DM 6.25-15 MG/5ML PO SYRP: 5.0000 mL | ORAL_SOLUTION | Freq: Four times a day (QID) | ORAL | 0 refills | Status: DC | PRN

## 2022-05-24 NOTE — ED Provider Notes (Signed)
[facility]    CSN: [id number] Arrival date & time: 05/24/22  0955      History   Chief Complaint Chief Complaint  Patient presents with   Cough   Nasal Congestion   Sore Throat   Headache   Generalized Body Aches    HPI Patricia Vazquez is a 36 y.o. female.   HPI  36 year old female here for evaluation of respiratory complaints.  Patient reports that for last 4 days she has been experiencing headache, body aches, sore throat, nasal congestion, and a nonproductive cough.  She states she also has diarrhea but also endorses that she has that all the time.  She has diarrhea every time she eats and that is been going on for over a year.  She has not seen anybody about this.  He took a home COVID test and it was -3 days ago.  Past Medical History:  Diagnosis Date   Gestational diabetes    Obesity     Patient Active Problem List   Diagnosis Date Noted   Allergic sinusitis 05/17/2018   BMI 45.0-49.9, adult ([facility]) 10/12/2017    Past Surgical History:  Procedure Laterality Date   CESAREAN SECTION WITH BILATERAL TUBAL LIGATION N/A 05/18/2018   Procedure: CESAREAN SECTION WITH BILATERAL TUBAL LIGATION;  Surgeon: Gae Dry, MD;  Location: [facility] OR[facility];  Service: Obstetrics;  Laterality: N/A;   NO PAST SURGERIES      OB History     Gravida  3   Para  3   Term  2   Preterm  1   AB      Living  3      SAB      IAB      Ectopic      Multiple  0   Live Births  3            Home Medications    Prior to Admission medications   Medication Sig Start Date End Date Taking? Authorizing Provider  benzonatate (TESSALON) 100 MG capsule Take 2 capsules (200 mg total) by mouth every 8 (eight) hours. 05/24/22  Yes Margarette Canada, NP  ipratropium (ATROVENT) 0.06 % nasal spray Place 2 sprays into both nostrils 4 (four) times daily. 05/24/22  Yes Margarette Canada, NP  promethazine-dextromethorphan (PROMETHAZINE-DM) 6.25-15 MG/5ML syrup Take 5 mLs by mouth 4  (four) times daily as needed. 05/24/22  Yes Margarette Canada, NP  meloxicam (MOBIC) 15 MG tablet Take 1 tablet (15 mg total) by mouth daily as needed. 01/24/19   Marylene Land, NP    Family History Family History  Problem Relation Age of Onset   Diabetes Maternal Grandfather     Social History Social History   Tobacco Use   Smoking status: Never   Smokeless tobacco: Never  Vaping Use   Vaping Use: Never used  Substance Use Topics   Alcohol use: No   Drug use: No     Allergies   Other   Review of Systems Review of Systems  Constitutional:  Negative for fever.  HENT:  Positive for congestion and sore throat. Negative for ear pain and rhinorrhea.   Respiratory:  Positive for cough. Negative for shortness of breath and wheezing.   Gastrointestinal:  Positive for diarrhea. Negative for nausea and vomiting.  Musculoskeletal:  Positive for arthralgias and myalgias.  Skin:  Negative for rash.  Neurological:  Positive for headaches.  Psychiatric/Behavioral: Negative.       Physical Exam Triage Vital Signs  ED Triage Vitals  Enc Vitals Group     BP 05/24/22 1007 128/85     Pulse Rate 05/24/22 1007 81     Resp --      Temp 05/24/22 1007 98.3 F (36.8 C)     Temp Source 05/24/22 1007 Oral     SpO2 05/24/22 1007 97 %     Weight 05/24/22 1005 (!) 320 lb (145.2 kg)     Height 05/24/22 1005 5\' 8"  (1.727 m)     Head Circumference --      Peak Flow --      Pain Score 05/24/22 1005 0     Pain Loc --      Pain Edu? --      Excl. in GC? --    No data found.  Updated Vital Signs BP 128/85 (BP Location: Left Arm)   Pulse 81   Temp 98.3 F (36.8 C) (Oral)   Ht 5\' 8"  (1.727 m)   Wt (!) 320 lb (145.2 kg)   LMP 05/17/2022 (Approximate)   SpO2 97%   BMI 48.66 kg/m   Visual Acuity Right Eye Distance:   Left Eye Distance:   Bilateral Distance:    Right Eye Near:   Left Eye Near:    Bilateral Near:     Physical Exam Vitals and nursing note reviewed.  Constitutional:       Appearance: Normal appearance. She is not ill-appearing.  HENT:     Head: Normocephalic and atraumatic.     Right Ear: Tympanic membrane, ear canal and external ear normal. There is no impacted cerumen.     Left Ear: Tympanic membrane, ear canal and external ear normal. There is no impacted cerumen.     Nose: Congestion and rhinorrhea present.     Mouth/Throat:     Mouth: Mucous membranes are moist.     Pharynx: Oropharynx is clear. Posterior oropharyngeal erythema present. No oropharyngeal exudate.  Cardiovascular:     Rate and Rhythm: Normal rate and regular rhythm.     Pulses: Normal pulses.     Heart sounds: Normal heart sounds. No murmur heard.    No friction rub. No gallop.  Pulmonary:     Effort: Pulmonary effort is normal.     Breath sounds: Normal breath sounds. No wheezing, rhonchi or rales.  Musculoskeletal:     Cervical back: Normal range of motion and neck supple.  Lymphadenopathy:     Cervical: No cervical adenopathy.  Skin:    General: Skin is warm and dry.     Capillary Refill: Capillary refill takes less than 2 seconds.     Findings: No erythema or rash.  Neurological:     General: No focal deficit present.     Mental Status: She is alert and oriented to person, place, and time.  Psychiatric:        Mood and Affect: Mood normal.        Behavior: Behavior normal.        Thought Content: Thought content normal.        Judgment: Judgment normal.      UC Treatments / Results  Labs (all labs ordered are listed, but only abnormal results are displayed) Labs Reviewed - No data to display  EKG   Radiology No results found.  Procedures Procedures (including critical care time)  Medications Ordered in UC Medications - No data to display  Initial Impression / Assessment and Plan / UC Course  I have reviewed the  triage vital signs and the nursing notes.  Pertinent labs & imaging results that were available during my care of the patient were reviewed  by me and considered in my medical decision making (see chart for details).   Patient is a nontoxic-appearing 36 year old female here for evaluation of viral illness symptoms have been going on for the past 4 days.  She took a home COVID test 3 days ago that was negative.  Her symptoms include headache, body aches, nasal congestion without nasal discharge, sore throat, nonproductive cough, and diarrhea.  No fever, ear pain, shortness breath or wheezing, nausea, or vomiting.  The diarrhea is an ongoing issue and she states she has it every time she eats.  That has been going on for over a year and she is not seeing anybody about this.  Patient does not currently have a PCP.  The remainder of her exam reveals pearly-gray tympanic membranes bilaterally with normal light reflex and clear external auditory canals.  Her nasal mucosa is erythematous and mildly edematous with scant clear discharge in both nares.  Oropharyngeal exam reveals benign tonsillar pillars.  Her posterior pharynx has mild erythema with clear postnasal drip.  No injection noted.  No anterior cervical lymphadenopathy on exam.  Cardiopulmonary exam reveals S1-S2 heart sounds with regular rate and rhythm and lung sounds are clear to auscultation all fields.  Patient exam is consistent with an upper respiratory infection.  It is most likely viral and believe her cough is being triggered by the post nasal drip.  We discussed testing for COVID despite the fact that her home test was negative.  We decided to take the patient out of work for the next 2 days as tomorrow would be day 5 of weight and her quarantine duration if it were COVID.  I will treat her symptoms with Atrovent nasal spray, Tessalon Perles, and Promethazine DM cough syrup.  Return precautions reviewed.  Work note provided.   Final Clinical Impressions(s) / UC Diagnoses   Final diagnoses:  Viral URI with cough     Discharge Instructions      Use the Atrovent nasal spray, 2  squirts in each nostril every 6 hours, as needed for runny nose and postnasal drip.  Use the Tessalon Perles every 8 hours during the day.  Take them with a small sip of water.  They may give you some numbness to the base of your tongue or a metallic taste in your mouth, this is normal.  Use the Promethazine DM cough syrup at bedtime for cough and congestion.  It will make you drowsy so do not take it during the day.  Return for reevaluation or see your primary care provider for any new or worsening symptoms.      ED Prescriptions     Medication Sig Dispense Auth. Provider   benzonatate (TESSALON) 100 MG capsule Take 2 capsules (200 mg total) by mouth every 8 (eight) hours. 21 capsule Becky Augusta, NP   ipratropium (ATROVENT) 0.06 % nasal spray Place 2 sprays into both nostrils 4 (four) times daily. 15 mL Becky Augusta, NP   promethazine-dextromethorphan (PROMETHAZINE-DM) 6.25-15 MG/5ML syrup Take 5 mLs by mouth 4 (four) times daily as needed. 118 mL Becky Augusta, NP      PDMP not reviewed this encounter.   Becky Augusta, NP 05/24/22 1049

## 2022-05-24 NOTE — Discharge Instructions (Signed)

## 2022-05-24 NOTE — ED Triage Notes (Signed)
Patient reports that she has a sore throat, congestion, headache, body ache. -- Started on Friday.   Covid test Saturday was negative.

## 2022-07-01 ENCOUNTER — Encounter: Payer: Self-pay | Admitting: Emergency Medicine

## 2022-07-01 ENCOUNTER — Ambulatory Visit
Admission: EM | Admit: 2022-07-01 | Discharge: 2022-07-01 | Disposition: A | Discharge status: Home / Self Care | Payer: Medicaid Other | Attending: Emergency Medicine | Admitting: Emergency Medicine

## 2022-07-01 DIAGNOSIS — J069 Acute upper respiratory infection, unspecified: Secondary | ICD-10-CM | POA: Insufficient documentation

## 2022-07-01 DIAGNOSIS — R051 Acute cough: Secondary | ICD-10-CM | POA: Insufficient documentation

## 2022-07-01 LAB — GROUP A STREP BY PCR: Group A Strep by PCR: NOT DETECTED

## 2022-07-01 MED ORDER — PROMETHAZINE-DM 6.25-15 MG/5ML PO SYRP: 5.0000 mL | ORAL_SOLUTION | Freq: Four times a day (QID) | ORAL | 0 refills | Status: AC | PRN

## 2022-07-01 MED ORDER — BENZONATATE 100 MG PO CAPS: 200.0000 mg | ORAL_CAPSULE | Freq: Three times a day (TID) | ORAL | 0 refills | Status: AC

## 2022-07-01 MED ORDER — AMOXICILLIN-POT CLAVULANATE 875-125 MG PO TABS: 1.0000 | ORAL_TABLET | Freq: Two times a day (BID) | ORAL | 0 refills | Status: AC

## 2022-07-01 MED ORDER — IPRATROPIUM BROMIDE 0.06 % NA SOLN: 2.0000 | Freq: Four times a day (QID) | NASAL | 12 refills | Status: AC

## 2022-07-01 NOTE — ED Provider Notes (Signed)
[facility]    CSN: [id number] Arrival date & time: 07/01/22  0840      History   Chief Complaint Chief Complaint  Patient presents with   Headache   Sore Throat   Cough    HPI Patricia Vazquez is a 36 y.o. female.   HPI  36 year old female here for evaluation of sore throat and cough.  Patient reports that she is been continue to experience a nonproductive cough for the last 6 weeks.  She was evaluated in this urgent care on May 24, 2022 and diagnosed with a viral URI with a cough.  The patient denies any fever, runny nose or nasal congestion, shortness breath or wheezing, known sick contacts, or GI complaints.  She did start having a sore throat yesterday with painful swallowing.  Past Medical History:  Diagnosis Date   Gestational diabetes    Obesity     Patient Active Problem List   Diagnosis Date Noted   Allergic sinusitis 05/17/2018   BMI 45.0-49.9, adult ([facility]) 10/12/2017    Past Surgical History:  Procedure Laterality Date   CESAREAN SECTION WITH BILATERAL TUBAL LIGATION N/A 05/18/2018   Procedure: CESAREAN SECTION WITH BILATERAL TUBAL LIGATION;  Surgeon: Gae Dry, MD;  Location: [facility] ORS;  Service: Obstetrics;  Laterality: N/A;   NO PAST SURGERIES      OB History     Gravida  3   Para  3   Term  2   Preterm  1   AB      Living  3      SAB      IAB      Ectopic      Multiple  0   Live Births  3            Home Medications    Prior to Admission medications   Medication Sig Start Date End Date Taking? Authorizing Provider  amoxicillin-clavulanate (AUGMENTIN) 875-125 MG tablet Take 1 tablet by mouth every 12 (twelve) hours for 10 days. 07/01/22 07/11/22 Yes Margarette Canada, NP  benzonatate (TESSALON) 100 MG capsule Take 2 capsules (200 mg total) by mouth every 8 (eight) hours. 07/01/22   Margarette Canada, NP  ipratropium (ATROVENT) 0.06 % nasal spray Place 2 sprays into both nostrils 4 (four) times daily.  07/01/22   Margarette Canada, NP  meloxicam (MOBIC) 15 MG tablet Take 1 tablet (15 mg total) by mouth daily as needed. 01/24/19   Marylene Land, NP  promethazine-dextromethorphan (PROMETHAZINE-DM) 6.25-15 MG/5ML syrup Take 5 mLs by mouth 4 (four) times daily as needed. 07/01/22   Margarette Canada, NP    Family History Family History  Problem Relation Age of Onset   Diabetes Maternal Grandfather     Social History Social History   Tobacco Use   Smoking status: Never   Smokeless tobacco: Never  Vaping Use   Vaping Use: Never used  Substance Use Topics   Alcohol use: No   Drug use: No     Allergies   Other   Review of Systems Review of Systems  Constitutional:  Negative for fever.  HENT:  Positive for sore throat. Negative for congestion, ear pain and rhinorrhea.   Respiratory:  Positive for cough. Negative for shortness of breath and wheezing.   Gastrointestinal:  Negative for diarrhea, nausea and vomiting.  Skin:  Negative for rash.  Hematological: Negative.   Psychiatric/Behavioral: Negative.       Physical Exam Triage Vital Signs ED Triage Vitals  Enc Vitals Group     BP 07/01/22 0903 (!) 116/90     Pulse Rate 07/01/22 0903 77     Resp 07/01/22 0903 16     Temp 07/01/22 0903 98.3 F (36.8 C)     Temp Source 07/01/22 0903 Oral     SpO2 07/01/22 0903 99 %     Weight --      Height --      Head Circumference --      Peak Flow --      Pain Score 07/01/22 0902 7     Pain Loc --      Pain Edu? --      Excl. in [place]? --    No data found.  Updated Vital Signs BP (!) 116/90 (BP Location: Left Arm)   Pulse 77   Temp 98.3 F (36.8 C) (Oral)   Resp 16   LMP  (LMP Unknown)   SpO2 99%   Visual Acuity Right Eye Distance:   Left Eye Distance:   Bilateral Distance:    Right Eye Near:   Left Eye Near:    Bilateral Near:     Physical Exam Vitals and nursing note reviewed.  Constitutional:      Appearance: Normal appearance. She is not ill-appearing.  HENT:      Head: Normocephalic and atraumatic.     Right Ear: Tympanic membrane, ear canal and external ear normal. There is no impacted cerumen.     Left Ear: Tympanic membrane, ear canal and external ear normal. There is no impacted cerumen.     Nose: Congestion and rhinorrhea present.     Mouth/Throat:     Mouth: Mucous membranes are moist.     Pharynx: Oropharynx is clear. Posterior oropharyngeal erythema present. No oropharyngeal exudate.  Cardiovascular:     Rate and Rhythm: Normal rate and regular rhythm.     Pulses: Normal pulses.     Heart sounds: Normal heart sounds. No murmur heard.    No friction rub. No gallop.  Pulmonary:     Effort: Pulmonary effort is normal.     Breath sounds: Normal breath sounds. No wheezing, rhonchi or rales.  Musculoskeletal:     Cervical back: Normal range of motion and neck supple.  Lymphadenopathy:     Cervical: No cervical adenopathy.  Skin:    General: Skin is warm and dry.     Capillary Refill: Capillary refill takes less than 2 seconds.     Findings: No erythema or rash.  Neurological:     General: No focal deficit present.     Mental Status: She is alert and oriented to person, place, and time.  Psychiatric:        Mood and Affect: Mood normal.        Behavior: Behavior normal.        Thought Content: Thought content normal.        Judgment: Judgment normal.      UC Treatments / Results  Labs (all labs ordered are listed, but only abnormal results are displayed) Labs Reviewed  GROUP A STREP BY PCR    EKG   Radiology No results found.  Procedures Procedures (including critical care time)  Medications Ordered in UC Medications - No data to display  Initial Impression / Assessment and Plan / UC Course  I have reviewed the triage vital signs and the nursing notes.  Pertinent labs & imaging results that were available during my care of the patient  were reviewed by me and considered in my medical decision making (see chart for  details).   Patient is a nontoxic-appearing 36 year old female here for evaluation of 6 weeks worth of a nonproductive cough and new onset sore throat as outlined in HPI above.  On exam patient continues to have erythematous and edematous nasal mucosa with scant clear rhinorrhea.  Her bilateral tonsillar pillars are 2+ edematous and erythematous but there is no exudate appreciated.  Posterior pharynx is also erythematous and injected with clear postnasal drip.  No cervical lymphadenopathy present on exam and her cardiopulmonary exam bisque lung sounds in all fields.  I will order strep PCR given the tonsillar enlargement and erythema.  Strep PCR is negative.  Patient has an upper respiratory infection based on her physical exam findings and given the duration of symptoms I feel that a trial of antibiotics is warranted.  I will place the patient on Augmentin twice daily for 10 days for treatment of her URI.  I will refill her Atrovent nasal spray to help with the congestion as well as her Tessalon Perles and Promethazine DM cough syrup to help with cough and congestion.  Return precautions reviewed.  Work note provided.   Final Clinical Impressions(s) / UC Diagnoses   Final diagnoses:  Upper respiratory tract infection, unspecified type  Acute cough     Discharge Instructions      The Augmentin twice daily with food for 10 days for treatment of your URI.  Perform sinus irrigation 2-3 times a day with a NeilMed sinus rinse kit and distilled water.  Do not use tap water.  You can use plain over-the-counter Mucinex every 6 hours to break up the stickiness of the mucus so your body can clear it.  Increase your oral fluid intake to thin out your mucus so that is also able for your body to clear more easily.  Take an over-the-counter probiotic, such as Culturelle-align-activia, 1 hour after each dose of antibiotic to prevent diarrhea.  Use the Atrovent nasal spray, 2 squirts in each nostril  every 6 hours, as needed for runny nose and postnasal drip.  Use the Tessalon Perles every 8 hours during the day.  Take them with a small sip of water.  They may give you some numbness to the base of your tongue or a metallic taste in your mouth, this is normal.  Use the Promethazine DM cough syrup at bedtime for cough and congestion.  It will make you drowsy so do not take it during the day.  If you develop any new or worsening symptoms return for reevaluation or see your primary care provider.      ED Prescriptions     Medication Sig Dispense Auth. Provider   benzonatate (TESSALON) 100 MG capsule Take 2 capsules (200 mg total) by mouth every 8 (eight) hours. 21 capsule Margarette Canada, NP   ipratropium (ATROVENT) 0.06 % nasal spray Place 2 sprays into both nostrils 4 (four) times daily. 15 mL Margarette Canada, NP   promethazine-dextromethorphan (PROMETHAZINE-DM) 6.25-15 MG/5ML syrup Take 5 mLs by mouth 4 (four) times daily as needed. 118 mL Margarette Canada, NP   amoxicillin-clavulanate (AUGMENTIN) 875-125 MG tablet Take 1 tablet by mouth every 12 (twelve) hours for 10 days. 20 tablet Margarette Canada, NP      PDMP not reviewed this encounter.   Margarette Canada, NP 07/01/22 1003

## 2022-07-01 NOTE — Discharge Instructions (Signed)
The Augmentin twice daily with food for 10 days for treatment of your URI.  Perform sinus irrigation 2-3 times a day with a NeilMed sinus rinse kit and distilled water.  Do not use tap water.  You can use plain over-the-counter Mucinex every 6 hours to break up the stickiness of the mucus so your body can clear it.  Increase your oral fluid intake to thin out your mucus so that is also able for your body to clear more easily.  Take an over-the-counter probiotic, such as Culturelle-align-activia, 1 hour after each dose of antibiotic to prevent diarrhea.  Use the Atrovent nasal spray, 2 squirts in each nostril every 6 hours, as needed for runny nose and postnasal drip.  Use the Tessalon Perles every 8 hours during the day.  Take them with a small sip of water.  They may give you some numbness to the base of your tongue or a metallic taste in your mouth, this is normal.  Use the Promethazine DM cough syrup at bedtime for cough and congestion.  It will make you drowsy so do not take it during the day.  If you develop any new or worsening symptoms return for reevaluation or see your primary care provider.  

## 2022-07-01 NOTE — ED Triage Notes (Signed)
Pt presents with a cough for several weeks and developed a sore throat yesterday.

## 2022-09-29 ENCOUNTER — Encounter: Payer: Self-pay | Admitting: Emergency Medicine

## 2022-09-29 ENCOUNTER — Ambulatory Visit
Admission: EM | Admit: 2022-09-29 | Discharge: 2022-09-29 | Disposition: A | Discharge status: Home / Self Care | Payer: Self-pay | Attending: Physician Assistant | Admitting: Physician Assistant

## 2022-09-29 DIAGNOSIS — J101 Influenza due to other identified influenza virus with other respiratory manifestations: Secondary | ICD-10-CM

## 2022-09-29 DIAGNOSIS — M791 Myalgia, unspecified site: Secondary | ICD-10-CM

## 2022-09-29 DIAGNOSIS — Z1152 Encounter for screening for COVID-19: Secondary | ICD-10-CM | POA: Insufficient documentation

## 2022-09-29 DIAGNOSIS — R051 Acute cough: Secondary | ICD-10-CM

## 2022-09-29 DIAGNOSIS — E669 Obesity, unspecified: Secondary | ICD-10-CM | POA: Insufficient documentation

## 2022-09-29 DIAGNOSIS — Z20828 Contact with and (suspected) exposure to other viral communicable diseases: Secondary | ICD-10-CM

## 2022-09-29 DIAGNOSIS — Z6841 Body Mass Index (BMI) 40.0 and over, adult: Secondary | ICD-10-CM | POA: Insufficient documentation

## 2022-09-29 LAB — RESP PANEL BY RT-PCR (RSV, FLU A&B, COVID)  RVPGX2
Influenza A by PCR: NEGATIVE
Influenza B by PCR: POSITIVE — AB
Resp Syncytial Virus by PCR: NEGATIVE
SARS Coronavirus 2 by RT PCR: NEGATIVE

## 2022-09-29 NOTE — ED Triage Notes (Signed)
Pt c/o body aches, cough, chills, sweats. Started this morning about 1am. She has not checked her temperature.

## 2022-09-29 NOTE — ED Provider Notes (Addendum)
[facility]    CSN: [id number] Arrival date & time: 09/29/22  0912      History   Chief Complaint Chief Complaint  Patient presents with   Generalized Body Aches    HPI Patricia Vazquez is a 37 y.o. female presenting for feeling feverish, chills, body aches and cough as well as sweats since early this morning.  She reports a mild cough yesterday but symptoms became full-blown today.  She reports that her child's had the flu.  She would like to be checked for flu and COVID today.  She has not been taking any medication for symptoms.  She is denying any sore throat, chest pain or shortness of breath, vomiting or diarrhea.  No other complaints.  HPI  Past Medical History:  Diagnosis Date   Gestational diabetes    Obesity     Patient Active Problem List   Diagnosis Date Noted   Allergic sinusitis 05/17/2018   BMI 45.0-49.9, adult ([facility]) 10/12/2017    Past Surgical History:  Procedure Laterality Date   CESAREAN SECTION WITH BILATERAL TUBAL LIGATION N/A 05/18/2018   Procedure: CESAREAN SECTION WITH BILATERAL TUBAL LIGATION;  Surgeon: Gae Dry, MD;  Location: [facility] OR[facility];  Service: Obstetrics;  Laterality: N/A;   NO PAST SURGERIES      OB History     Gravida  3   Para  3   Term  2   Preterm  1   AB      Living  3      SAB      IAB      Ectopic      Multiple  0   Live Births  3            Home Medications    Prior to Admission medications   Medication Sig Start Date End Date Taking? Authorizing Provider  benzonatate (TESSALON) 100 MG capsule Take 2 capsules (200 mg total) by mouth every 8 (eight) hours. 07/01/22   Margarette Canada, NP  ipratropium (ATROVENT) 0.06 % nasal spray Place 2 sprays into both nostrils 4 (four) times daily. 07/01/22   Margarette Canada, NP  meloxicam (MOBIC) 15 MG tablet Take 1 tablet (15 mg total) by mouth daily as needed. 01/24/19   Marylene Land, NP  promethazine-dextromethorphan (PROMETHAZINE-DM) 6.25-15  MG/5ML syrup Take 5 mLs by mouth 4 (four) times daily as needed. 07/01/22   Margarette Canada, NP    Family History Family History  Problem Relation Age of Onset   Diabetes Maternal Grandfather     Social History Social History   Tobacco Use   Smoking status: Never   Smokeless tobacco: Never  Vaping Use   Vaping Use: Never used  Substance Use Topics   Alcohol use: No   Drug use: No     Allergies   Other   Review of Systems Review of Systems  Constitutional:  Positive for chills, diaphoresis, fatigue and fever.  HENT:  Positive for congestion and rhinorrhea. Negative for ear pain, sinus pressure, sinus pain and sore throat.   Respiratory:  Positive for cough. Negative for shortness of breath.   Gastrointestinal:  Negative for abdominal pain, nausea and vomiting.  Musculoskeletal:  Positive for myalgias.  Skin:  Negative for rash.  Neurological:  Negative for weakness and headaches.  Hematological:  Negative for adenopathy.     Physical Exam Triage Vital Signs ED Triage Vitals  Enc Vitals Group     BP  Pulse      Resp      Temp      Temp src      SpO2      Weight      Height      Head Circumference      Peak Flow      Pain Score      Pain Loc      Pain Edu?      Excl. in [place]?    No data found.  Updated Vital Signs BP (!) 134/92 (BP Location: Right Arm)   Pulse 99   Temp 98.3 F (36.8 C) (Oral)   Resp 16   Ht 5\' 8"  (1.727 m)   Wt (!) 320 lb 1.7 oz (145.2 kg)   LMP 09/25/2022 (Approximate)   SpO2 96%   BMI 48.67 kg/m   Physical Exam Vitals and nursing note reviewed.  Constitutional:      General: She is not in acute distress.    Appearance: Normal appearance. She is not ill-appearing or toxic-appearing.  HENT:     Head: Normocephalic and atraumatic.     Nose: Congestion present.     Mouth/Throat:     Mouth: Mucous membranes are moist.     Pharynx: Oropharynx is clear.  Eyes:     General: No scleral icterus.       Right eye: No  discharge.        Left eye: No discharge.     Conjunctiva/sclera: Conjunctivae normal.  Cardiovascular:     Rate and Rhythm: Normal rate and regular rhythm.     Heart sounds: Normal heart sounds.  Pulmonary:     Effort: Pulmonary effort is normal. No respiratory distress.     Breath sounds: Normal breath sounds.  Musculoskeletal:     Cervical back: Neck supple.  Skin:    General: Skin is dry.  Neurological:     General: No focal deficit present.     Mental Status: She is alert. Mental status is at baseline.     Motor: No weakness.     Gait: Gait normal.  Psychiatric:        Mood and Affect: Mood normal.        Behavior: Behavior normal.        Thought Content: Thought content normal.      UC Treatments / Results  Labs (all labs ordered are listed, but only abnormal results are displayed) Labs Reviewed  RESP PANEL BY RT-PCR (RSV, FLU A&B, COVID)  RVPGX2 - Abnormal; Notable for the following components:      Result Value   Influenza B by PCR POSITIVE (*)    All other components within normal limits    EKG   Radiology No results found.  Procedures Procedures (including critical care time)  Medications Ordered in UC Medications - No data to display  Initial Impression / Assessment and Plan / UC Course  I have reviewed the triage vital signs and the nursing notes.  Pertinent labs & imaging results that were available during my care of the patient were reviewed by me and considered in my medical decision making (see chart for details).   37 year old female presents for feeling feverish with fatigue, body aches, cough, congestion.  Symptoms became full-blown today but cough began yesterday.  Exposed to the flu.  She is currently afebrile and overall well-appearing.  No acute distress.  On exam she has nasal congestion.  Throat is clear.  Chest clear auscultation heart  regular rate and rhythm.  Respiratory panel positive for influenza B.  Reviewed result with patient.  She declines Tamiflu. Reviewed isolating and staying home for the next few days until she is improving.  Encourage plenty rest and fluids with over-the-counter cough medications, Profen and Tylenol.  Reviewed return and ER precautions.  Work note given.  Final Clinical Impressions(s) / UC Diagnoses   Final diagnoses:  Influenza B  Myalgia  Acute cough  Exposure to the flu     Discharge Instructions      -You are positive for influenza B. - You should stay home for the next couple of days. - Mucinex, ibuprofen, Tylenol, rest and fluids. - You should be improving over the next 1-2 weeks.     ED Prescriptions   None    PDMP not reviewed this encounter.   Shirlee Latch, PA-C 09/29/22 1228    Eusebio Friendly B, PA-C 09/29/22 1228

## 2022-09-29 NOTE — Discharge Instructions (Signed)
-  You are positive for influenza B. - You should stay home for the next couple of days. - Mucinex, ibuprofen, Tylenol, rest and fluids. - You should be improving over the next 1-2 weeks.

## 2023-03-22 ENCOUNTER — Ambulatory Visit
Admission: EM | Admit: 2023-03-22 | Discharge: 2023-03-22 | Disposition: A | Discharge status: Home / Self Care | Payer: Medicaid Other | Attending: Emergency Medicine | Admitting: Emergency Medicine

## 2023-03-22 DIAGNOSIS — R519 Headache, unspecified: Secondary | ICD-10-CM | POA: Diagnosis not present

## 2023-03-22 MED ORDER — DEXAMETHASONE SODIUM PHOSPHATE 10 MG/ML IJ SOLN
10.0000 mg | Freq: Once | INTRAMUSCULAR | Status: AC
  Administered 2023-03-22: 10 mg via INTRAMUSCULAR

## 2023-03-22 MED ORDER — KETOROLAC TROMETHAMINE 30 MG/ML IJ SOLN
30.0000 mg | Freq: Once | INTRAMUSCULAR | Status: AC
  Administered 2023-03-22: 30 mg via INTRAMUSCULAR

## 2023-03-22 NOTE — ED Provider Notes (Signed)
[facility]    CSN: [id number] Arrival date & time: 03/22/23  1041      History   Chief Complaint Chief Complaint  Patient presents with   Headache    HPI Patricia Vazquez is a 37 y.o. female.   Patient presents for evaluation of headaches that have been occurring daily for 7 days.  Typically occurring upon awakening with associated photophobia and nausea without vomiting.  Worsening today as she is unable to break the headache, typically able to manage with Tylenol rapid release and Excedrin.  Headache present starting at the bridge of the nose circling around the right eye.  History of migraines.  Denies increase in stress, diet or medication changes.  Has increased fluid intake over the past 2 weeks with minimal caffeine intake.  No changes in sleep habits, no recent illness.     Past Medical History:  Diagnosis Date   Gestational diabetes    Obesity     Patient Active Problem List   Diagnosis Date Noted   Allergic sinusitis 05/17/2018   BMI 45.0-49.9, adult ([facility]) 10/12/2017    Past Surgical History:  Procedure Laterality Date   CESAREAN SECTION WITH BILATERAL TUBAL LIGATION N/A 05/18/2018   Procedure: CESAREAN SECTION WITH BILATERAL TUBAL LIGATION;  Surgeon: Nadara Mustard, MD;  Location: [facility] ORS;  Service: Obstetrics;  Laterality: N/A;   NO PAST SURGERIES      OB History     Gravida  3   Para  3   Term  2   Preterm  1   AB      Living  3      SAB      IAB      Ectopic      Multiple  0   Live Births  3            Home Medications    Prior to Admission medications   Medication Sig Start Date End Date Taking? Authorizing Provider  benzonatate (TESSALON) 100 MG capsule Take 2 capsules (200 mg total) by mouth every 8 (eight) hours. 07/01/22   Becky Augusta, NP  ipratropium (ATROVENT) 0.06 % nasal spray Place 2 sprays into both nostrils 4 (four) times daily. 07/01/22   Becky Augusta, NP  meloxicam (MOBIC) 15 MG tablet Take 1  tablet (15 mg total) by mouth daily as needed. 01/24/19   Renford Dills, NP  promethazine-dextromethorphan (PROMETHAZINE-DM) 6.25-15 MG/5ML syrup Take 5 mLs by mouth 4 (four) times daily as needed. 07/01/22   Becky Augusta, NP    Family History Family History  Problem Relation Age of Onset   Diabetes Maternal Grandfather     Social History Social History   Tobacco Use   Smoking status: Never   Smokeless tobacco: Never  Vaping Use   Vaping status: Never Used  Substance Use Topics   Alcohol use: No   Drug use: No     Allergies   Other   Review of Systems Review of Systems  Constitutional: Negative.   HENT: Negative.    Respiratory: Negative.    Cardiovascular: Negative.   Neurological:  Positive for headaches. Negative for dizziness, tremors, seizures, syncope, facial asymmetry, speech difficulty, weakness, light-headedness and numbness.     Physical Exam Triage Vital Signs ED Triage Vitals  Encounter Vitals Group     BP 03/22/23 1106 129/88     Systolic BP Percentile --      Diastolic BP Percentile --      Pulse  Rate 03/22/23 1106 75     Resp 03/22/23 1106 16     Temp 03/22/23 1106 98 F (36.7 C)     Temp Source 03/22/23 1106 Oral     SpO2 03/22/23 1106 97 %     Weight 03/22/23 1106 (!) 318 lb (144.2 kg)     Height 03/22/23 1106 5\' 8"  (1.727 m)     Head Circumference --      Peak Flow --      Pain Score 03/22/23 1109 8     Pain Loc --      Pain Education --      Exclude from Growth Chart --    No data found.  Updated Vital Signs BP 129/88 (BP Location: Right Arm)   Pulse 75   Temp 98 F (36.7 C) (Oral)   Resp 16   Ht 5\' 8"  (1.727 m)   Wt (!) 318 lb (144.2 kg)   SpO2 97%   BMI 48.35 kg/m   Visual Acuity Right Eye Distance:   Left Eye Distance:   Bilateral Distance:    Right Eye Near:   Left Eye Near:    Bilateral Near:     Physical Exam Constitutional:      Appearance: Normal appearance. She is well-developed.  Eyes:     Extraocular  Movements: Extraocular movements intact.     Conjunctiva/sclera: Conjunctivae normal.     Pupils: Pupils are equal, round, and reactive to light.  Pulmonary:     Effort: Pulmonary effort is normal.  Neurological:     General: No focal deficit present.     Mental Status: She is alert and oriented to person, place, and time. Mental status is at baseline.     Cranial Nerves: No cranial nerve deficit.     Sensory: No sensory deficit.     Motor: No weakness.     Gait: Gait normal.      UC Treatments / Results  Labs (all labs ordered are listed, but only abnormal results are displayed) Labs Reviewed - No data to display  EKG   Radiology No results found.  Procedures Procedures (including critical care time)  Medications Ordered in UC Medications - No data to display  Initial Impression / Assessment and Plan / UC Course  I have reviewed the triage vital signs and the nursing notes.  Pertinent labs & imaging results that were available during my care of the patient were reviewed by me and considered in my medical decision making (see chart for details).  Bad headache  Vitals are stable patient is in no signs of distress nontoxic-appearing, no neurological deficits, unable to determine trigger, will move forward with symptomatic treatment, Toradol and Decadron injection given in office and recommended to continue use of over-the-counter medications for home management, low headaches present recommended low stimulation activities with adequate rest and fluid intake, symptoms begin to occur more often will need to establish care with PCP, verbalized understanding, may follow-up with urgent care as needed and given strict precautions for headache worsening in intensity and severity to go to the nearest emergency department for immediate evaluation Final Clinical Impressions(s) / UC Diagnoses   Final diagnoses:  None   Discharge Instructions   None    ED Prescriptions   None     PDMP not reviewed this encounter.   Valinda Hoar, NP 03/22/23 1723

## 2023-03-22 NOTE — Discharge Instructions (Signed)
For your headache -On exam there are no abnormalities neurologically -You have been given an injection of Toradol and Decadron here in the office today to help minimize your symptoms -You may continue use of over the counter mdications for management at home -While headaches are present ensure that you are getting adequate rest and adequate fluid intake -Participate in low stimulation activities avoiding bright lights and loud noises when symptoms are present -If your headaches continue to persist you will need to get a primary doctor for additional work up to determine cause  -At any point if you have the worst headache of your life please go to the nearest emergency department for immediate evaluation

## 2023-03-22 NOTE — ED Triage Notes (Signed)
Pt c/o HA x7 days. Also c/o light sensitivity,nausea & fatigue since this AM. Hx of migraines. Has tried tylenol w/o relief. Last dose around 0750 this AM.

## 2024-01-25 ENCOUNTER — Encounter: Admitting: Certified Nurse Midwife

## 2024-01-31 ENCOUNTER — Encounter: Payer: Self-pay | Admitting: Certified Nurse Midwife
